# Patient Record
Sex: Male | Born: 1980 | Race: White | Hispanic: No | Marital: Married | Smoking: Never smoker
Health system: Southern US, Community
[De-identification: ages and names within clinical notes are randomized; demographics above are authoritative.]

## PROBLEM LIST (undated history)

## (undated) DIAGNOSIS — J45909 Unspecified asthma, uncomplicated: Secondary | ICD-10-CM

## (undated) DIAGNOSIS — I10 Essential (primary) hypertension: Secondary | ICD-10-CM

## (undated) DIAGNOSIS — F32A Depression, unspecified: Secondary | ICD-10-CM

## (undated) DIAGNOSIS — F419 Anxiety disorder, unspecified: Secondary | ICD-10-CM

## (undated) DIAGNOSIS — J449 Chronic obstructive pulmonary disease, unspecified: Secondary | ICD-10-CM

## (undated) DIAGNOSIS — F329 Major depressive disorder, single episode, unspecified: Secondary | ICD-10-CM

## (undated) HISTORY — PX: PARATHYROIDECTOMY: SHX19

---
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
## 2011-12-11 ENCOUNTER — Emergency Department: Payer: Self-pay | Admitting: Emergency Medicine

## 2011-12-11 LAB — COMPREHENSIVE METABOLIC PANEL
BUN: 10 mg/dL (ref 7–18)
Chloride: 106 mmol/L (ref 98–107)
Creatinine: 1.02 mg/dL (ref 0.60–1.30)
EGFR (African American): >60
EGFR (Non-African Amer.): >60
Glucose: 92 mg/dL (ref 65–99)
Potassium: 3.9 mmol/L (ref 3.5–5.1)
SGOT(AST): 24 U/L (ref 15–37)
SGPT (ALT): 32 U/L (ref 12–78)
Total Protein: 6.8 g/dL (ref 6.4–8.2)

## 2011-12-11 LAB — CBC WITH DIFFERENTIAL/PLATELET
Basophil #: 0 10*3/uL (ref 0.0–0.1)
Basophil %: 0.1 %
Eosinophil #: 0.7 10*3/uL (ref 0.0–0.7)
Eosinophil %: 5.5 %
HCT: 42.3 % (ref 40.0–52.0)
HGB: 14.5 g/dL (ref 13.0–18.0)
Lymphocyte %: 15.2 %
MCH: 29 pg (ref 26.0–34.0)
MCHC: 34.2 g/dL (ref 32.0–36.0)
MCV: 85 fL (ref 80–100)
Monocyte #: 0.9 x10 3/mm (ref 0.2–1.0)
Neutrophil #: 8.8 10*3/uL — ABNORMAL HIGH (ref 1.4–6.5)
Platelet: 234 10*3/uL (ref 150–440)
RDW: 13.3 % (ref 11.5–14.5)

## 2012-01-09 ENCOUNTER — Ambulatory Visit: Payer: Self-pay | Admitting: Family Medicine

## 2012-01-22 ENCOUNTER — Ambulatory Visit: Payer: Self-pay | Admitting: Family Medicine

## 2012-01-30 ENCOUNTER — Ambulatory Visit: Payer: Self-pay | Admitting: Orthopedic Surgery

## 2012-07-03 ENCOUNTER — Ambulatory Visit: Payer: Self-pay | Admitting: Family Medicine

## 2012-08-29 ENCOUNTER — Ambulatory Visit: Payer: Self-pay | Admitting: Anesthesiology

## 2012-09-25 ENCOUNTER — Ambulatory Visit: Payer: Self-pay | Admitting: Anesthesiology

## 2012-11-27 ENCOUNTER — Ambulatory Visit: Payer: Self-pay | Admitting: Anesthesiology
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
## 2017-06-25 ENCOUNTER — Emergency Department: Payer: BLUE CROSS/BLUE SHIELD

## 2017-06-25 ENCOUNTER — Observation Stay
Admission: EM | Admit: 2017-06-25 | Discharge: 2017-06-27 | Disposition: A | Discharge status: Home / Self Care | Payer: BLUE CROSS/BLUE SHIELD | Attending: Family Medicine | Admitting: Family Medicine

## 2017-06-25 ENCOUNTER — Encounter: Payer: Self-pay | Admitting: Emergency Medicine

## 2017-06-25 ENCOUNTER — Other Ambulatory Visit: Payer: Self-pay

## 2017-06-25 DIAGNOSIS — Z79891 Long term (current) use of opiate analgesic: Secondary | ICD-10-CM | POA: Insufficient documentation

## 2017-06-25 DIAGNOSIS — G8929 Other chronic pain: Secondary | ICD-10-CM | POA: Insufficient documentation

## 2017-06-25 DIAGNOSIS — Z79899 Other long term (current) drug therapy: Secondary | ICD-10-CM | POA: Diagnosis not present

## 2017-06-25 DIAGNOSIS — R1013 Epigastric pain: Principal | ICD-10-CM | POA: Insufficient documentation

## 2017-06-25 DIAGNOSIS — J45901 Unspecified asthma with (acute) exacerbation: Secondary | ICD-10-CM

## 2017-06-25 DIAGNOSIS — M549 Dorsalgia, unspecified: Secondary | ICD-10-CM | POA: Diagnosis not present

## 2017-06-25 DIAGNOSIS — I10 Essential (primary) hypertension: Secondary | ICD-10-CM | POA: Insufficient documentation

## 2017-06-25 DIAGNOSIS — M7918 Myalgia, other site: Secondary | ICD-10-CM

## 2017-06-25 DIAGNOSIS — R0789 Other chest pain: Secondary | ICD-10-CM | POA: Diagnosis present

## 2017-06-25 DIAGNOSIS — I209 Angina pectoris, unspecified: Secondary | ICD-10-CM

## 2017-06-25 DIAGNOSIS — R079 Chest pain, unspecified: Secondary | ICD-10-CM | POA: Diagnosis present

## 2017-06-25 DIAGNOSIS — R918 Other nonspecific abnormal finding of lung field: Secondary | ICD-10-CM | POA: Diagnosis not present

## 2017-06-25 DIAGNOSIS — Z8249 Family history of ischemic heart disease and other diseases of the circulatory system: Secondary | ICD-10-CM | POA: Insufficient documentation

## 2017-06-25 DIAGNOSIS — J45909 Unspecified asthma, uncomplicated: Secondary | ICD-10-CM | POA: Diagnosis not present

## 2017-06-25 HISTORY — DX: Unspecified asthma, uncomplicated: J45.909

## 2017-06-25 HISTORY — DX: Essential (primary) hypertension: I10

## 2017-06-25 LAB — INFLUENZA PANEL BY PCR (TYPE A & B)
INFLAPCR: NEGATIVE
Influenza B By PCR: NEGATIVE

## 2017-06-25 LAB — COMPREHENSIVE METABOLIC PANEL
ALT: 23 U/L (ref 17–63)
AST: 27 U/L (ref 15–41)
Albumin: 4.3 g/dL (ref 3.5–5.0)
Alkaline Phosphatase: 91 U/L (ref 38–126)
Anion gap: 10 (ref 5–15)
BUN: 9 mg/dL (ref 6–20)
CHLORIDE: 104 mmol/L (ref 101–111)
CO2: 25 mmol/L (ref 22–32)
Calcium: 9 mg/dL (ref 8.9–10.3)
Creatinine, Ser: 0.96 mg/dL (ref 0.61–1.24)
Glucose, Bld: 85 mg/dL (ref 65–99)
POTASSIUM: 3.5 mmol/L (ref 3.5–5.1)
Sodium: 139 mmol/L (ref 135–145)
Total Bilirubin: 0.8 mg/dL (ref 0.3–1.2)
Total Protein: 8.2 g/dL — ABNORMAL HIGH (ref 6.5–8.1)

## 2017-06-25 LAB — CBC
HCT: 45.1 % (ref 40.0–52.0)
Hemoglobin: 15.4 g/dL (ref 13.0–18.0)
MCH: 28 pg (ref 26.0–34.0)
MCHC: 34.1 g/dL (ref 32.0–36.0)
MCV: 81.9 fL (ref 80.0–100.0)
PLATELETS: 363 10*3/uL (ref 150–440)
RBC: 5.51 MIL/uL (ref 4.40–5.90)
RDW: 14 % (ref 11.5–14.5)
WBC: 13.4 10*3/uL — ABNORMAL HIGH (ref 3.8–10.6)

## 2017-06-25 LAB — LIPID PANEL
CHOLESTEROL: 211 mg/dL — AB (ref 0–200)
HDL: 54 mg/dL (ref >40)
LDL CALC: 138 mg/dL — AB (ref 0–99)
TRIGLYCERIDES: 93 mg/dL (ref <150)
Total CHOL/HDL Ratio: 3.9 RATIO
VLDL: 19 mg/dL (ref 0–40)

## 2017-06-25 LAB — LIPASE, BLOOD: Lipase: 29 U/L (ref 11–51)

## 2017-06-25 LAB — TROPONIN I

## 2017-06-25 MED ORDER — QUETIAPINE FUMARATE 25 MG PO TABS
50.0000 mg | ORAL_TABLET | Freq: Every day | ORAL | Status: DC
  Administered 2017-06-25 – 2017-06-26 (×2): 50 mg via ORAL
  Filled 2017-06-25 (×2): qty 2

## 2017-06-25 MED ORDER — MORPHINE SULFATE (PF) 2 MG/ML IV SOLN
2.0000 mg | Freq: Once | INTRAVENOUS | Status: AC
  Administered 2017-06-25: 2 mg via INTRAVENOUS
  Filled 2017-06-25: qty 1

## 2017-06-25 MED ORDER — SODIUM CHLORIDE 0.9 % IV BOLUS
1000.0000 mL | Freq: Once | INTRAVENOUS | Status: AC
  Administered 2017-06-25: 1000 mL via INTRAVENOUS

## 2017-06-25 MED ORDER — IPRATROPIUM-ALBUTEROL 0.5-2.5 (3) MG/3ML IN SOLN
3.0000 mL | RESPIRATORY_TRACT | Status: DC
  Administered 2017-06-25 – 2017-06-26 (×2): 3 mL via RESPIRATORY_TRACT
  Filled 2017-06-25 (×2): qty 3

## 2017-06-25 MED ORDER — MORPHINE SULFATE (PF) 4 MG/ML IV SOLN
4.0000 mg | Freq: Once | INTRAVENOUS | Status: AC
  Administered 2017-06-25: 4 mg via INTRAVENOUS

## 2017-06-25 MED ORDER — IPRATROPIUM-ALBUTEROL 0.5-2.5 (3) MG/3ML IN SOLN
3.0000 mL | Freq: Once | RESPIRATORY_TRACT | Status: AC
  Administered 2017-06-25: 3 mL via RESPIRATORY_TRACT
  Filled 2017-06-25: qty 3

## 2017-06-25 MED ORDER — DOCUSATE SODIUM 100 MG PO CAPS: 100.0000 mg | ORAL_CAPSULE | Freq: Two times a day (BID) | ORAL | Status: DC | PRN

## 2017-06-25 MED ORDER — ALBUTEROL SULFATE (2.5 MG/3ML) 0.083% IN NEBU
3.0000 mL | INHALATION_SOLUTION | Freq: Four times a day (QID) | RESPIRATORY_TRACT | Status: DC
  Filled 2017-06-25: qty 3

## 2017-06-25 MED ORDER — OXYCODONE-ACETAMINOPHEN 5-325 MG PO TABS
1.0000 | ORAL_TABLET | Freq: Once | ORAL | Status: AC
  Administered 2017-06-25: 1 via ORAL
  Filled 2017-06-25: qty 1

## 2017-06-25 MED ORDER — FAMOTIDINE IN NACL 20-0.9 MG/50ML-% IV SOLN
20.0000 mg | Freq: Once | INTRAVENOUS | Status: AC
  Administered 2017-06-25: 20 mg via INTRAVENOUS
  Filled 2017-06-25: qty 50

## 2017-06-25 MED ORDER — IPRATROPIUM-ALBUTEROL 0.5-2.5 (3) MG/3ML IN SOLN
3.0000 mL | Freq: Once | RESPIRATORY_TRACT | Status: AC
  Administered 2017-06-25: 3 mL via RESPIRATORY_TRACT

## 2017-06-25 MED ORDER — IPRATROPIUM-ALBUTEROL 0.5-2.5 (3) MG/3ML IN SOLN
RESPIRATORY_TRACT | Status: AC
  Administered 2017-06-25: 3 mL via RESPIRATORY_TRACT
  Filled 2017-06-25: qty 3

## 2017-06-25 MED ORDER — KETOROLAC TROMETHAMINE 30 MG/ML IJ SOLN
30.0000 mg | Freq: Once | INTRAMUSCULAR | Status: AC
  Administered 2017-06-25: 30 mg via INTRAVENOUS
  Filled 2017-06-25: qty 1

## 2017-06-25 MED ORDER — IOHEXOL 350 MG/ML SOLN
75.0000 mL | Freq: Once | INTRAVENOUS | Status: AC | PRN
  Administered 2017-06-25: 75 mL via INTRAVENOUS

## 2017-06-25 MED ORDER — TRAMADOL HCL 50 MG PO TABS
50.0000 mg | ORAL_TABLET | ORAL | Status: DC | PRN
  Administered 2017-06-25 – 2017-06-26 (×3): 50 mg via ORAL
  Filled 2017-06-25 (×4): qty 1

## 2017-06-25 MED ORDER — HEPARIN SODIUM (PORCINE) 5000 UNIT/ML IJ SOLN
5000.0000 [IU] | Freq: Three times a day (TID) | INTRAMUSCULAR | Status: DC
  Administered 2017-06-26 – 2017-06-27 (×3): 5000 [IU] via SUBCUTANEOUS
  Filled 2017-06-25 (×3): qty 1

## 2017-06-25 MED ORDER — METOPROLOL TARTRATE 25 MG PO TABS
12.5000 mg | ORAL_TABLET | Freq: Two times a day (BID) | ORAL | Status: DC
  Administered 2017-06-25 – 2017-06-26 (×2): 12.5 mg via ORAL
  Filled 2017-06-25 (×2): qty 1

## 2017-06-25 MED ORDER — MORPHINE SULFATE (PF) 4 MG/ML IV SOLN
INTRAVENOUS | Status: AC
  Administered 2017-06-25: 4 mg via INTRAVENOUS
  Filled 2017-06-25: qty 1

## 2017-06-25 MED ORDER — IRBESARTAN 150 MG PO TABS
150.0000 mg | ORAL_TABLET | Freq: Every day | ORAL | Status: DC
  Administered 2017-06-25 – 2017-06-26 (×2): 150 mg via ORAL
  Filled 2017-06-25 (×2): qty 1

## 2017-06-25 MED ORDER — METHYLPREDNISOLONE SODIUM SUCC 125 MG IJ SOLR
125.0000 mg | Freq: Once | INTRAMUSCULAR | Status: AC
  Administered 2017-06-25: 125 mg via INTRAVENOUS
  Filled 2017-06-25: qty 2

## 2017-06-25 NOTE — ED Notes (Signed)
This RN spoke with lab they are running Lipase now.

## 2017-06-25 NOTE — ED Notes (Signed)
Pt was exp. wheezing when this RN assessed. This RN obtained verbal for 1 DUONEB tx. administered at this time.

## 2017-06-25 NOTE — ED Provider Notes (Signed)
[facility] Emergency Department Provider Note ____________________________________________   First MD Initiated Contact with Patient 06/25/17 1557     (approximate)  I have reviewed the triage vital signs and the nursing notes.   HISTORY  Chief Complaint Chest Pain    HPI Raymond Haney is a 37 y.o. male with history of asthma and hypertension who presents with shortness of breath over the last 2 days, gradual onset, persistent course, associated with wheezing, and feeling somewhat similar to prior asthma.  Patient states that in addition he has had chest pain, substernal but radiating to the left side of the chest, sharp, and worse with deep inspiration.  He states this is not typical for him during his asthma exacerbations.   Past Medical History:  Diagnosis Date  . Asthma   . Hypertension     There are no active problems to display for this patient.   History reviewed. No pertinent surgical history.  Prior to Admission medications   Medication Sig Start Date End Date Taking? Authorizing Provider  valsartan (DIOVAN) 160 MG tablet Take 1 tablet by mouth daily. 05/22/17   [provider]    Allergies Patient has no known allergies.  No family history on file.  Social History Social History   Tobacco Use  . Smoking status: Never Smoker  Substance Use Topics  . Alcohol use: Not on file  . Drug use: Not on file    Review of Systems  Constitutional: No fever. Eyes: No redness. ENT: No sore throat. Cardiovascular: Positive for chest pain. Respiratory: Positive for shortness of breath. Gastrointestinal: No vomiting.   Genitourinary: Negative for flank pain.  Musculoskeletal: Negative for back pain. Skin: Negative for rash. Neurological: Negative for headache.   ____________________________________________   PHYSICAL EXAM:  VITAL SIGNS: ED Triage Vitals  Enc Vitals Group     BP 06/25/17 1527 (!) 166/126     Pulse  Rate 06/25/17 1527 (!) 103     Resp 06/25/17 1527 20     Temp 06/25/17 1527 99 F (37.2 C)     Temp Source 06/25/17 1527 Oral     SpO2 06/25/17 1527 99 %     Weight 06/25/17 1532 250 lb (113.4 kg)     Height 06/25/17 1532 5\' 9"  (1.753 m)     Head Circumference --      Peak Flow --      Pain Score 06/25/17 1532 5     Pain Loc --      Pain Edu? --      Excl. in GC? --     Constitutional: Alert and oriented.  Uncomfortable appearing but in no acute distress. Eyes: Conjunctivae are normal.  Head: Atraumatic. Nose: No congestion/rhinnorhea. Mouth/Throat: Mucous membranes are slightly dry.   Neck: Normal range of motion.  Cardiovascular: Tachycardic, regular rhythm. Grossly normal heart sounds.  Good peripheral circulation. Respiratory: Normal respiratory effort.  No retractions.  Diffuse bilateral wheezing. Gastrointestinal: No distention.  Musculoskeletal:   Extremities warm and well perfused.  Neurologic:  Normal speech and language. No gross focal neurologic deficits are appreciated.  Skin:  Skin is warm and dry. No rash noted. Psychiatric: Mood and affect are normal. Speech and behavior are normal.  ____________________________________________   LABS (all labs ordered are listed, but only abnormal results are displayed)  Labs Reviewed  CBC - Abnormal; Notable for the following components:      Result Value   WBC 13.4 (*)    All other  components within normal limits  COMPREHENSIVE METABOLIC PANEL - Abnormal; Notable for the following components:   Total Protein 8.2 (*)    All other components within normal limits  TROPONIN I  INFLUENZA PANEL BY PCR (TYPE A & B)  LIPASE, BLOOD   ____________________________________________  EKG  ED ECG REPORT I, Dionne BucySebastian Adalin Vanderploeg, the attending physician, personally viewed and interpreted this ECG.  Date: 06/25/2017 EKG Time: 1522 Rate: 110 Rhythm: Sinus tachycardia QRS Axis: normal Intervals: normal ST/T Wave abnormalities:  normal Narrative Interpretation: no evidence of acute ischemia   ED ECG REPORT I, Dionne BucySebastian Adlai Nieblas, the attending physician, personally viewed and interpreted this ECG.  Date: 06/25/2017 EKG Time: 1754 Rate: 122 Rhythm: Sinus tachycardia QRS Axis: normal Intervals: LP FB ST/T Wave abnormalities: normal Narrative Interpretation: no evidence of acute ischemia; no significant change when compared to EKG of 1522 today  ____________________________________________  RADIOLOGY  CXR: No focal infiltrate or other acute findings  ____________________________________________   PROCEDURES  Procedure(s) performed: No  Procedures  Critical Care performed: No ____________________________________________   INITIAL IMPRESSION / ASSESSMENT AND PLAN / ED COURSE  Pertinent labs & imaging results that were available during my care of the patient were reviewed by me and considered in my medical decision making (see chart for details).  37 year old male with history of asthma and hypertension presents with shortness of breath and wheezing over the last 2 days which is similar to prior asthma, but with chest pain which is not typical for him.  On exam, the patient has hypertension and borderline tachycardia but is oxygenating well, and has diffuse wheezing on lung exam.  Past medical records reviewed in Epic and are noncontributory; patient has no recent ED visits or hospitalizations.  Overall presentation is consistent with asthma exacerbation, viral bronchitis, or less likely pneumonia.  Although the chest pain is atypical for the patient I have low suspicion for ACS given the significant wheezing and the atypical nature of the pain, as well as his age.  Also low suspicion for PE given the presence of wheezing, and the lack of signs or symptoms suggestive of DVT.  Plan: Chest x-ray, nebulizer, steroid, analgesia, labs, and reassess.    ----------------------------------------- 6:11 PM  on 06/25/2017 -----------------------------------------  On reassessment, patient reports no significant improvement in the shortness of breath and states that the pain is actually somewhat worse.  He did appear slightly more comfortable for some time when I reassessed him, but then when we went back to the room the patient was hyperventilating and appeared very uncomfortable.  Pain now controlled with morphine.  Although patient has no hypoxia and no specific PE risk factors, given the persistent severe chest pain and the tachycardia, there is some concern for PE.  I will obtain a CT chest to rule out.  If negative, likely admit for asthma.  ----------------------------------------- 8:28 PM on 06/25/2017 -----------------------------------------  CT chest is negative.  Patient reports improved pain although it is still present.  His shortness of breath is somewhat improved.  On exam he still has diffuse bilateral wheezing.  His O2 sat is in the high 90s but drops with any exertion, and his heart rate goes to the 120s with minimal exertion such as moving in the bed.  At this time, given the abnormal vital signs and persistent significant wheezing, patient would be appropriate for admission.  I will sign the patient out to the hospitalist. ____________________________________________   FINAL CLINICAL IMPRESSION(S) / ED DIAGNOSES  Final diagnoses:  Asthma with  acute exacerbation, unspecified asthma severity, unspecified whether persistent  Atypical chest pain      NEW MEDICATIONS STARTED DURING THIS VISIT:  New Prescriptions   No medications on file     Note:  This document was prepared using Dragon voice recognition software and may include unintentional dictation errors.    Dionne Bucy, MD 06/25/17 2029

## 2017-06-25 NOTE — ED Triage Notes (Signed)
Just prior to arrival states he was laying down and something was "sitting on my chest". Speaking full sentences with no wheeze noted. States too inhaler at home.

## 2017-06-25 NOTE — ED Notes (Signed)
Pt called out stating chest pain was worse. Pt is Stable no changes noted.

## 2017-06-25 NOTE — ED Notes (Signed)
This Rn answered pt light, in person. Pt was hyperventilating, and is diaphoretic. Pt Vs as follows: 115 HR 95% RA, 156/120 BP. Pt states his body is numb. EDP made aware.

## 2017-06-25 NOTE — ED Triage Notes (Signed)
First Nurse Note:  States had an asthma attack today about 15 minutes PTA.  Arrives to ED with C/O chest pain.

## 2017-06-25 NOTE — ED Notes (Signed)
Pt has had chest pain and SHOB for the past few days. Pt has asthma and hx of parathyroidectomy.  Pt is transported to xray at this time.

## 2017-06-25 NOTE — H&P (Signed)
[employer] [facility] [place] at [facility]   PATIENT NAME: Raymond Haney    MR#:  [id number]  DATE OF BIRTH:  [birth year]  DATE OF ADMISSION:  06/25/2017  PRIMARY CARE PHYSICIAN: System, Pcp Not In   REQUESTING/REFERRING PHYSICIAN: [place]  CHIEF COMPLAINT:   Chief Complaint  Patient presents with  . Chest Pain    HISTORY OF PRESENT ILLNESS: Raymond Haney  is a 37 y.o. male with a known history of asthma, hypertension- started having chest pain which is all over the chest and getting worse withlaying flat in the bed and feels little better while sitting up.the pain is pressure-like and constant. 8-9 out of 10.Started having pain 2 days ago the day before he had done some heavy work of shoveling. Patient also have some shortness of breath and some wheezing associated with this. CT scan the chest was done in the ER which was noncontributory, patient's father had coronary disease and recent death so ER physician suggested to admit for further cardiac workup.  PAST MEDICAL HISTORY:   Past Medical History:  Diagnosis Date  . Asthma   . Hypertension     PAST SURGICAL HISTORY: History reviewed. No pertinent surgical history.  SOCIAL HISTORY:  Social History   Tobacco Use  . Smoking status: Never Smoker  Substance Use Topics  . Alcohol use: Not on file    FAMILY HISTORY:  Family History  Problem Relation Age of Onset  . CAD Father     DRUG ALLERGIES: No Known Allergies  REVIEW OF SYSTEMS:   CONSTITUTIONAL: No fever, fatigue or weakness.  EYES: No blurred or double vision.  EARS, NOSE, AND THROAT: No tinnitus or ear pain.  RESPIRATORY: No cough, shortness of breath, wheezing or hemoptysis.  CARDIOVASCULAR: positive for chest pain,no orthopnea, edema.  GASTROINTESTINAL: No nausea, vomiting, diarrhea or abdominal pain.  GENITOURINARY: No dysuria, hematuria.  ENDOCRINE: No polyuria, nocturia,  HEMATOLOGY: No anemia, easy bruising or bleeding SKIN: No rash or  lesion. MUSCULOSKELETAL: No joint pain or arthritis.   NEUROLOGIC: No tingling, numbness, weakness.  PSYCHIATRY: No anxiety or depression.   MEDICATIONS AT HOME:  Prior to Admission medications   Medication Sig Start Date End Date Taking? Authorizing Provider  QUEtiapine (SEROQUEL) 50 MG tablet Take 1 tablet by mouth at bedtime. 06/18/17  Yes [provider]  valsartan (DIOVAN) 160 MG tablet Take 1 tablet by mouth daily. 05/22/17  Yes [provider]  albuterol (PROVENTIL HFA;VENTOLIN HFA) 108 (90 Base) MCG/ACT inhaler Inhale 2 puffs into the lungs every 6 (six) hours as needed. 05/28/17   [provider]  albuterol (PROVENTIL) (2.5 MG/3ML) 0.083% nebulizer solution Take 3 mLs by nebulization every 6 (six) hours as needed. 05/08/17   [provider]  traMADol (ULTRAM) 50 MG tablet Take 1 tablet by mouth every 4 (four) hours as needed. 06/03/17   [provider]      PHYSICAL EXAMINATION:   VITAL SIGNS: Blood pressure (!) 142/115, pulse (!) 103, temperature 99 F (37.2 C), temperature source Oral, resp. rate 15, height 5\' 9"  (1.753 m), weight 113.4 kg (250 lb), SpO2 99 %.  GENERAL:  37 y.o.-year-old patient lying in the bed with no acute distress.  EYES: Pupils equal, round, reactive to light and accommodation. No scleral icterus. Extraocular muscles intact.  HEENT: Head atraumatic, normocephalic. Oropharynx and nasopharynx clear.  NECK:  Supple, no jugular venous distention. No thyroid enlargement, no tenderness.  LUNGS: Normal breath sounds bilaterally, no wheezing, rales,rhonchi or crepitation. No  use of accessory muscles of respiration.  CARDIOVASCULAR: S1, S2 normal. No murmurs, rubs, or gallops.  ABDOMEN: Soft, nontender, nondistended. Bowel sounds present. No organomegaly or mass.  EXTREMITIES: No pedal edema, cyanosis, or clubbing.  NEUROLOGIC: Cranial nerves II through XII are intact. Muscle strength 5/5 in all extremities. Sensation intact.  Gait not checked.  PSYCHIATRIC: The patient is alert and oriented x 3.  SKIN: No obvious rash, lesion, or ulcer.   LABORATORY PANEL:   CBC Recent Labs  Lab 06/25/17 1540  WBC 13.4*  HGB 15.4  HCT 45.1  PLT 363  MCV 81.9  MCH 28.0  MCHC 34.1  RDW 14.0   ------------------------------------------------------------------------------------------------------------------  Chemistries  Recent Labs  Lab 06/25/17 1540  NA 139  K 3.5  CL 104  CO2 25  GLUCOSE 85  BUN 9  CREATININE 0.96  CALCIUM 9.0  AST 27  ALT 23  ALKPHOS 91  BILITOT 0.8   ------------------------------------------------------------------------------------------------------------------ estimated creatinine clearance is 129.6 mL/min (by C-G formula based on SCr of 0.96 mg/dL). ------------------------------------------------------------------------------------------------------------------ No results for input(s): TSH, T4TOTAL, T3FREE, THYROIDAB in the last 72 hours.  Invalid input(s): FREET3   Coagulation profile No results for input(s): INR, PROTIME in the last 168 hours. ------------------------------------------------------------------------------------------------------------------- No results for input(s): DDIMER in the last 72 hours. -------------------------------------------------------------------------------------------------------------------  Cardiac Enzymes Recent Labs  Lab 06/25/17 1540  TROPONINI <0.03   ------------------------------------------------------------------------------------------------------------------ Invalid input(s): POCBNP  ---------------------------------------------------------------------------------------------------------------  Urinalysis No results found for: COLORURINE, APPEARANCEUR, LABSPEC, PHURINE, GLUCOSEU, HGBUR, BILIRUBINUR, KETONESUR, PROTEINUR, UROBILINOGEN, NITRITE, LEUKOCYTESUR   RADIOLOGY: Dg Chest 2 View  Result Date: 06/25/2017 CLINICAL  DATA:  Chest pain and shortness of breath. History of asthma. EXAM: CHEST - 2 VIEW COMPARISON:  None. FINDINGS: The heart size and mediastinal contours are within normal limits. Both lungs are clear. The visualized skeletal structures are unremarkable. IMPRESSION: No active cardiopulmonary disease. Electronically Signed   By: Elsie StainJohn T Curnes M.D.   On: 06/25/2017 16:01   Ct Angio Chest Pe W And/or Wo Contrast  Result Date: 06/25/2017 CLINICAL DATA:  Chest pain and shortness of breath. History of asthma. EXAM: CT ANGIOGRAPHY CHEST WITH CONTRAST TECHNIQUE: Multidetector CT imaging of the chest was performed using the standard protocol during bolus administration of intravenous contrast. Multiplanar CT image reconstructions and MIPs were obtained to evaluate the vascular anatomy. CONTRAST:  75mL OMNIPAQUE IOHEXOL 350 MG/ML SOLN COMPARISON:  Chest x-ray from same day. FINDINGS: Cardiovascular: Suboptimal opacification of the segmental pulmonary arteries due to contrast bolus timing. Evaluation is also limited due to motion artifact. No central or lobar pulmonary embolism. Normal heart size. No pericardial effusion. Normal caliber thoracic aorta. Mediastinum/Nodes: No enlarged mediastinal, hilar, or axillary lymph nodes. Thyroid gland, trachea, and esophagus demonstrate no significant findings. Lungs/Pleura: Lungs are clear. No pleural effusion or pneumothorax. No suspicious pulmonary nodule. Upper Abdomen: No acute abnormality. Hepatic steatosis. 1.8 cm round, enhancing lesion in the spleen. Musculoskeletal: Bilateral gynecomastia. No acute or significant osseous findings. Review of the MIP images confirms the above findings. IMPRESSION: 1. No central or lobar pulmonary embolism. Evaluation of the segmental pulmonary arteries is limited due to suboptimal opacification and motion artifact. 2.  No acute intrathoracic process. 3. Hepatic steatosis. 4. Indeterminate 1.8 cm enhancing lesion in the spleen. Recommend  follow-up contrast-enhanced MRI of the abdomen in 6 months for further evaluation. This recommendation follows ACR consensus guidelines: White Paper of the ACR Incidental Findings Committee II on Splenic and Nodal Findings. J Am Coll Radiol [phone];10:789-794. Electronically Signed   By: Chrissie NoaWilliam  Howell Pringle M.D.   On: 06/25/2017 19:36    EKG: Orders placed or performed during the hospital encounter of 06/25/17  . EKG 12-Lead  . EKG 12-Lead  . ED EKG within 10 minutes  . ED EKG within 10 minutes    IMPRESSION AND PLAN:  * chest pain   Monitor on telemetry, follow serial troponin, echocardiogram.   Check lipid panel and hemoglobin A1c.   If further workups are negative then we can attribute this pain towards his heavy physical activities 3-4 days ago and discharged him with muscle relaxors and analgesics.  * asthma   Currently mild wheezing, I will give DuoNeb nebulizer treatment.  * Hypertension   Continue medication and metoprolol.  All the records are reviewed and case discussed with ED provider. Management plans discussed with the patient, family and they are in agreement.  CODE STATUS: full.    TOTAL TIME TAKING CARE OF THIS PATIENT: 45 minutes.    Altamese Dilling M.D on 06/25/2017   Between 7am to 6pm - Pager - [phone]  After 6pm go to [email or web address] - password [email or web address]  [facility]  Office  [phone]  CC: Primary care physician; System, Pcp Not In   Note: This dictation was prepared with Dragon dictation along with smaller phrase technology. Any transcriptional errors that result from this process are unintentional.

## 2017-06-26 ENCOUNTER — Observation Stay: Payer: BLUE CROSS/BLUE SHIELD

## 2017-06-26 ENCOUNTER — Observation Stay
Admit: 2017-06-26 | Discharge: 2017-06-26 | Disposition: A | Discharge status: Home / Self Care | Payer: BLUE CROSS/BLUE SHIELD | Attending: Internal Medicine | Admitting: Internal Medicine

## 2017-06-26 DIAGNOSIS — R1013 Epigastric pain: Secondary | ICD-10-CM | POA: Diagnosis not present

## 2017-06-26 DIAGNOSIS — M7918 Myalgia, other site: Secondary | ICD-10-CM | POA: Diagnosis not present

## 2017-06-26 LAB — BASIC METABOLIC PANEL
ANION GAP: 11 (ref 5–15)
BUN: 11 mg/dL (ref 6–20)
CO2: 23 mmol/L (ref 22–32)
CREATININE: 0.99 mg/dL (ref 0.61–1.24)
Calcium: 8.8 mg/dL — ABNORMAL LOW (ref 8.9–10.3)
Chloride: 106 mmol/L (ref 101–111)
Glucose, Bld: 143 mg/dL — ABNORMAL HIGH (ref 65–99)
Potassium: 3.6 mmol/L (ref 3.5–5.1)
SODIUM: 140 mmol/L (ref 135–145)

## 2017-06-26 LAB — SEDIMENTATION RATE: Sed Rate: 16 mm/hr — ABNORMAL HIGH (ref 0–15)

## 2017-06-26 LAB — CBC
HCT: 43 % (ref 40.0–52.0)
HEMOGLOBIN: 14.4 g/dL (ref 13.0–18.0)
MCH: 27.8 pg (ref 26.0–34.0)
MCHC: 33.4 g/dL (ref 32.0–36.0)
MCV: 83.3 fL (ref 80.0–100.0)
PLATELETS: 365 10*3/uL (ref 150–440)
RBC: 5.17 MIL/uL (ref 4.40–5.90)
RDW: 14.4 % (ref 11.5–14.5)
WBC: 14.1 10*3/uL — AB (ref 3.8–10.6)

## 2017-06-26 LAB — LACTIC ACID, PLASMA
LACTIC ACID, VENOUS: 1.8 mmol/L (ref 0.5–1.9)
LACTIC ACID, VENOUS: 3.1 mmol/L — AB (ref 0.5–1.9)

## 2017-06-26 LAB — HEMOGLOBIN A1C
Hgb A1c MFr Bld: 5.4 % (ref 4.8–5.6)
Mean Plasma Glucose: 108.28 mg/dL

## 2017-06-26 LAB — TROPONIN I

## 2017-06-26 MED ORDER — GI COCKTAIL ~~LOC~~
30.0000 mL | Freq: Once | ORAL | Status: AC
  Administered 2017-06-26: 30 mL via ORAL
  Filled 2017-06-26: qty 30

## 2017-06-26 MED ORDER — CYCLOBENZAPRINE HCL 10 MG PO TABS
5.0000 mg | ORAL_TABLET | Freq: Three times a day (TID) | ORAL | Status: DC
  Administered 2017-06-26 – 2017-06-27 (×3): 5 mg via ORAL
  Filled 2017-06-26 (×3): qty 1

## 2017-06-26 MED ORDER — CYCLOBENZAPRINE HCL 10 MG PO TABS
10.0000 mg | ORAL_TABLET | Freq: Once | ORAL | Status: AC
  Administered 2017-06-26: 10 mg via ORAL
  Filled 2017-06-26: qty 1

## 2017-06-26 MED ORDER — METOPROLOL TARTRATE 50 MG PO TABS
50.0000 mg | ORAL_TABLET | Freq: Two times a day (BID) | ORAL | Status: DC
  Administered 2017-06-26 – 2017-06-27 (×3): 50 mg via ORAL
  Filled 2017-06-26 (×3): qty 1

## 2017-06-26 MED ORDER — MORPHINE SULFATE (PF) 2 MG/ML IV SOLN
2.0000 mg | INTRAVENOUS | Status: DC | PRN
  Administered 2017-06-26 – 2017-06-27 (×7): 2 mg via INTRAVENOUS
  Filled 2017-06-26 (×7): qty 1

## 2017-06-26 MED ORDER — HYDRALAZINE HCL 20 MG/ML IJ SOLN
10.0000 mg | INTRAMUSCULAR | Status: DC | PRN
  Administered 2017-06-26: 10 mg via INTRAVENOUS
  Filled 2017-06-26: qty 1

## 2017-06-26 MED ORDER — AMLODIPINE BESYLATE 5 MG PO TABS
5.0000 mg | ORAL_TABLET | Freq: Every day | ORAL | Status: DC
  Administered 2017-06-26 – 2017-06-27 (×2): 5 mg via ORAL
  Filled 2017-06-26 (×3): qty 1

## 2017-06-26 MED ORDER — ACETAMINOPHEN 325 MG PO TABS: 650.0000 mg | ORAL_TABLET | Freq: Four times a day (QID) | ORAL | Status: DC | PRN

## 2017-06-26 MED ORDER — PANTOPRAZOLE SODIUM 40 MG IV SOLR
40.0000 mg | Freq: Two times a day (BID) | INTRAVENOUS | Status: DC
  Administered 2017-06-26 – 2017-06-27 (×3): 40 mg via INTRAVENOUS
  Filled 2017-06-26 (×3): qty 40

## 2017-06-26 MED ORDER — SUCRALFATE 1 GM/10ML PO SUSP
1.0000 g | Freq: Three times a day (TID) | ORAL | Status: DC
  Administered 2017-06-26 – 2017-06-27 (×5): 1 g via ORAL
  Filled 2017-06-26 (×8): qty 10

## 2017-06-26 MED ORDER — ADULT MULTIVITAMIN W/MINERALS CH
1.0000 | ORAL_TABLET | Freq: Every day | ORAL | Status: DC
  Administered 2017-06-26 – 2017-06-27 (×2): 1 via ORAL
  Filled 2017-06-26 (×2): qty 1

## 2017-06-26 MED ORDER — PREMIER PROTEIN SHAKE
11.0000 [oz_av] | Freq: Two times a day (BID) | ORAL | Status: DC
  Administered 2017-06-26: 11 [oz_av] via ORAL

## 2017-06-26 MED ORDER — ALBUTEROL SULFATE (2.5 MG/3ML) 0.083% IN NEBU: 3.0000 mL | INHALATION_SOLUTION | Freq: Four times a day (QID) | RESPIRATORY_TRACT | Status: DC | PRN

## 2017-06-26 NOTE — Progress Notes (Signed)
Pt's b/p still elevated after receiving morning dose of metoprolol. Dr. Katheren ShamsSalary notified and orders for IV Hydralazine PRN, and increase dose metoprolol 50 mg. Will Administer and continue to monitor,

## 2017-06-26 NOTE — Progress Notes (Signed)
Pt called this RN stating he was woken up from 10 out of 10 chest pain. Describing as pressure., after receiving 50mg  tramadol. Pt's BP is also still elevated after receiving 12.5mg   po metoprolol and 150mg  po irbesartan.  Vitals:   06/25/17 2349 06/26/17 0121  BP:  (!) 178/112  Pulse:  (!) 107  Resp:  18  Temp:    SpO2: 97% 98%  Spoke with Dr. Caryn BeeMaier, she gave orders for one time dose 10mg  flexeril, 5mg  norvasc daily starting now and 2mg  morphine for pain q4hr PRN.  Will give norvasc and start with flexeril for pain.  Shirley FriarAlexis Miller, RN, BSN

## 2017-06-26 NOTE — Plan of Care (Signed)
  Problem: Pain Managment: Goal: General experience of comfort will improve Outcome: Not Progressing   Pt admitted with chest pain, pt still currently having chest pain, I have treated pt 3 times with PRNs, tramadol, flexeril, and morphine.   On tele box [id number], skin intact both verified by takera nesbitt, RN.  Wife at bedside, BP still high but starting to come down.

## 2017-06-26 NOTE — Consult Note (Signed)
Midge Minium, MD [facility]  [address], [address] [place], [place] [address] Phone: [phone] Fax : [phone]  Consultation  Referring Provider:     Dr. Katheren Shams Primary Care Physician:  System, Pcp Not In Primary Gastroenterologist: Gentry Fitz         Reason for Consultation:     Abdominal pain  Date of Admission:  06/25/2017 Date of Consultation:  06/26/2017         HPI:   Raymond Haney is a 37 y.o. male who comes in with a history of 4 days of abdominal pain.  The patient states that 2 days before the pain started he was shoveling a large amount of gravel.  The patient also suffers from chronic back pain.  The patient now reports that his upper abdominal pain is both on the left and right and epigastric area.  It is not made worse with any eating drinking or moving his bowels.  He also denies any nausea or vomiting.  The patient reports that there is nothing associated with his GI tract that makes the pain any better or worse.  He states that when the pain came he became short of breath and was taking his inhalers but the pain did not go away and increased over the past few days.  The patient denies fatty food or greasy foods make the pain any worse.  He has never had this pain in the past.  He had a CT scan of the chest that showed a 1.8 cm lesion of the spleen and some fatty liver but his liver enzymes were normal.  Past Medical History:  Diagnosis Date  . Asthma   . Hypertension     Past Surgical History:  Procedure Laterality Date  . PARATHYROIDECTOMY      Prior to Admission medications   Medication Sig Start Date End Date Taking? Authorizing Provider  QUEtiapine (SEROQUEL) 50 MG tablet Take 1 tablet by mouth at bedtime. 06/18/17  Yes [provider]  valsartan (DIOVAN) 160 MG tablet Take 1 tablet by mouth daily. 05/22/17  Yes [provider]  albuterol (PROVENTIL HFA;VENTOLIN HFA) 108 (90 Base) MCG/ACT inhaler Inhale 2 puffs into the lungs every 6 (six)  hours as needed. 05/28/17   [provider]  albuterol (PROVENTIL) (2.5 MG/3ML) 0.083% nebulizer solution Take 3 mLs by nebulization every 6 (six) hours as needed. 05/08/17   [provider]  traMADol (ULTRAM) 50 MG tablet Take 1 tablet by mouth every 4 (four) hours as needed. 06/03/17   [provider]    Family History  Problem Relation Age of Onset  . CAD Father      Social History   Tobacco Use  . Smoking status: Never Smoker  . Smokeless tobacco: Never Used  Substance Use Topics  . Alcohol use: Not Currently  . Drug use: Not Currently    Allergies as of 06/25/2017  . (No Known Allergies)    Review of Systems:    All systems reviewed and negative except where noted in HPI.   Physical Exam:  Vital signs in last 24 hours: Temp:  [98.4 F (36.9 C)-98.6 F (37 C)] 98.6 F (37 C) (04/02 0807) Pulse Rate:  [73-114] 73 (04/02 1135) Resp:  [16-29] 18 (04/02 0807) BP: (151-178)/(102-130) 173/118 (04/02 1135) SpO2:  [97 %-100 %] 98 % (04/02 1135) Weight:  [226 lb 6.4 oz (102.7 kg)] 226 lb 6.4 oz (102.7 kg) (04/01 2313) Last BM Date: 06/24/17 General:   Pleasant,  cooperative in NAD Head:  Normocephalic and atraumatic. Eyes:   No icterus.   Conjunctiva pink. PERRLA. Ears:  Normal auditory acuity. Neck:  Supple; no masses or thyroidomegaly Lungs: Respirations even and unlabored. Lungs clear to auscultation bilaterally.   No wheezes, crackles, or rhonchi.  Heart:  Regular rate and rhythm;  Without murmur, clicks, rubs or gallops Abdomen:  Soft, nondistended, abdominal tenderness to one finger palpation while flexing the abdominal wall muscles by raising the patient's legs 6 inches above the bed.  Positive Carnett sign. normal bowel sounds. No appreciable masses or hepatomegaly.  No rebound or guarding.  Rectal:  Not performed. Msk:  Symmetrical without gross deformities.  Positive tenderness to one finger palpation while flexing the abdominal wall muscles  by raising the patient's legs above the exam Extremities:  Without edema, cyanosis or clubbing. Neurologic:  Alert and oriented x3;  grossly normal neurologically. Skin:  Intact without significant lesions or rashes. Cervical Nodes:  No significant cervical adenopathy. Psych:  Alert and cooperative. Normal affect.  LAB RESULTS: Recent Labs    06/25/17 1540 06/26/17 0332  WBC 13.4* 14.1*  HGB 15.4 14.4  HCT 45.1 43.0  PLT 363 365   BMET Recent Labs    06/25/17 1540 06/26/17 0332  NA 139 140  K 3.5 3.6  CL 104 106  CO2 25 23  GLUCOSE 85 143*  BUN 9 11  CREATININE 0.96 0.99  CALCIUM 9.0 8.8*   LFT Recent Labs    06/25/17 1540  PROT 8.2*  ALBUMIN 4.3  AST 27  ALT 23  ALKPHOS 91  BILITOT 0.8   PT/INR No results for input(s): LABPROT, INR in the last 72 hours.  STUDIES: Dg Chest 2 View  Result Date: 06/25/2017 CLINICAL DATA:  Chest pain and shortness of breath. History of asthma. EXAM: CHEST - 2 VIEW COMPARISON:  None. FINDINGS: The heart size and mediastinal contours are within normal limits. Both lungs are clear. The visualized skeletal structures are unremarkable. IMPRESSION: No active cardiopulmonary disease. Electronically Signed   By: Elsie Stain M.D.   On: 06/25/2017 16:01   Ct Angio Chest Pe W And/or Wo Contrast  Result Date: 06/25/2017 CLINICAL DATA:  Chest pain and shortness of breath. History of asthma. EXAM: CT ANGIOGRAPHY CHEST WITH CONTRAST TECHNIQUE: Multidetector CT imaging of the chest was performed using the standard protocol during bolus administration of intravenous contrast. Multiplanar CT image reconstructions and MIPs were obtained to evaluate the vascular anatomy. CONTRAST:  75mL OMNIPAQUE IOHEXOL 350 MG/ML SOLN COMPARISON:  Chest x-ray from same day. FINDINGS: Cardiovascular: Suboptimal opacification of the segmental pulmonary arteries due to contrast bolus timing. Evaluation is also limited due to motion artifact. No central or lobar pulmonary  embolism. Normal heart size. No pericardial effusion. Normal caliber thoracic aorta. Mediastinum/Nodes: No enlarged mediastinal, hilar, or axillary lymph nodes. Thyroid gland, trachea, and esophagus demonstrate no significant findings. Lungs/Pleura: Lungs are clear. No pleural effusion or pneumothorax. No suspicious pulmonary nodule. Upper Abdomen: No acute abnormality. Hepatic steatosis. 1.8 cm round, enhancing lesion in the spleen. Musculoskeletal: Bilateral gynecomastia. No acute or significant osseous findings. Review of the MIP images confirms the above findings. IMPRESSION: 1. No central or lobar pulmonary embolism. Evaluation of the segmental pulmonary arteries is limited due to suboptimal opacification and motion artifact. 2.  No acute intrathoracic process. 3. Hepatic steatosis. 4. Indeterminate 1.8 cm enhancing lesion in the spleen. Recommend follow-up contrast-enhanced MRI of the abdomen in 6 months for further evaluation. This recommendation follows ACR  consensus guidelines: White Paper of the ACR Incidental Findings Committee II on Splenic and Nodal Findings. J Am Coll Radiol [phone];10:[id number]. Electronically Signed   By: Obie DredgeWilliam T Derry M.D.   On: 06/25/2017 19:36   [place] Liver Doppler  Result Date: 06/26/2017 CLINICAL DATA:  Epigastric pain x3 days. Hepatic steatosis described on recent CT chest. EXAM: DUPLEX ULTRASOUND OF LIVER TECHNIQUE: Color and duplex Doppler ultrasound was performed to evaluate the hepatic in-flow and out-flow vessels. COMPARISON:  CT 06/25/2017 FINDINGS: Portal Vein 10 mm diameter. No evidence of occlusion or thrombus. Velocities (all hepatopetal): Main:  34-52 cm/sec Right:  29 cm/sec Left:  26 cm/sec Hepatic Vein Velocities (all hepatofugal): Right:  27 cm/sec Middle:  21 cm/sec Left:  20 cm/sec Hepatic Artery Velocity:  60 cm/sec Spleen 7.2 x 11 x 3.9 cm (volume = 160 cm^3). Splenic Vein: No evidence of occlusion or thrombus. Velocity: 14 cm/sec Varices: None identified  Ascites: None IMPRESSION: 1. Unremarkable hepatic vascular Doppler evaluation. Electronically Signed   By: Corlis Leak  Hassell M.D.   On: 06/26/2017 11:31      Impression / Plan:   Raymond Haney is a 37 y.o. y/o male with musculoskeletal pain that is reproducible by flexing the abdominal wall muscles and palpating the muscles lightly with one finger.  The patient's symptoms are not consistent with peptic ulcer disease or GI cause since he has no exacerbating or relieving symptoms related to his GI tract.  The patient CT scan did not show any other abnormalities to explain his pain.  The patient will be treated with Flexeril which he states he has taken in the past because he has had back pain.  He should also take NSAIDs with food upon discharge to help the abdominal muscular pain and he has been told to use warm compresses.  I will sign off.  Please call if any further GI concerns or questions.  We would like to thank you for the opportunity to participate in the care of Raymond Haney.    Thank you for involving me in the care of this patient.      LOS: 0 days   Midge Miniumarren Dee Maday, MD  06/26/2017, 4:50 PM   Note: This dictation was prepared with Dragon dictation along with smaller phrase technology. Any transcriptional errors that result from this process are unintentional.

## 2017-06-26 NOTE — Progress Notes (Signed)
[facility] - [place] at [facility]   PATIENT NAME: Raymond Haney    MR#:  [id number]  DATE OF BIRTH:  08-Sep-1980  SUBJECTIVE:  CHIEF COMPLAINT:   Chief Complaint  Patient presents with  . Chest Pain  To complain of upper abdominal pain, no better compared to yesterday, has been persistent  REVIEW OF SYSTEMS:  CONSTITUTIONAL: No fever, fatigue or weakness.  EYES: No blurred or double vision.  EARS, NOSE, AND THROAT: No tinnitus or ear pain.  RESPIRATORY: No cough, shortness of breath, wheezing or hemoptysis.  CARDIOVASCULAR: No chest pain, orthopnea, edema.  GASTROINTESTINAL: No nausea, vomiting, diarrhea or abdominal pain.  GENITOURINARY: No dysuria, hematuria.  ENDOCRINE: No polyuria, nocturia,  HEMATOLOGY: No anemia, easy bruising or bleeding SKIN: No rash or lesion. MUSCULOSKELETAL: No joint pain or arthritis.   NEUROLOGIC: No tingling, numbness, weakness.  PSYCHIATRY: No anxiety or depression.   ROS  DRUG ALLERGIES:  No Known Allergies  VITALS:  Blood pressure (!) 157/106, pulse 92, temperature 98.6 F (37 C), temperature source Oral, resp. rate 18, height 5\' 9"  (1.753 m), weight 102.7 kg (226 lb 6.4 oz), SpO2 100 %.  PHYSICAL EXAMINATION:  GENERAL:  37 y.o.-year-old patient lying in the bed with no acute distress.  EYES: Pupils equal, round, reactive to light and accommodation. No scleral icterus. Extraocular muscles intact.  HEENT: Head atraumatic, normocephalic. Oropharynx and nasopharynx clear.  NECK:  Supple, no jugular venous distention. No thyroid enlargement, no tenderness.  LUNGS: Normal breath sounds bilaterally, no wheezing, rales,rhonchi or crepitation. No use of accessory muscles of respiration.  CARDIOVASCULAR: S1, S2 normal. No murmurs, rubs, or gallops.  ABDOMEN: Epigastric tenderness, no rebound, nondistended. Bowel sounds present. No organomegaly or mass.  EXTREMITIES: No pedal edema, cyanosis, or clubbing.  NEUROLOGIC: Cranial  nerves II through XII are intact. Muscle strength 5/5 in all extremities. Sensation intact. Gait not checked.  PSYCHIATRIC: The patient is alert and oriented x 3.  SKIN: No obvious rash, lesion, or ulcer.   Physical Exam LABORATORY PANEL:   CBC Recent Labs  Lab 06/26/17 0332  WBC 14.1*  HGB 14.4  HCT 43.0  PLT 365   ------------------------------------------------------------------------------------------------------------------  Chemistries  Recent Labs  Lab 06/25/17 1540 06/26/17 0332  NA 139 140  K 3.5 3.6  CL 104 106  CO2 25 23  GLUCOSE 85 143*  BUN 9 11  CREATININE 0.96 0.99  CALCIUM 9.0 8.8*  AST 27  --   ALT 23  --   ALKPHOS 91  --   BILITOT 0.8  --    ------------------------------------------------------------------------------------------------------------------  Cardiac Enzymes Recent Labs  Lab 06/25/17 1540 06/26/17 0813  TROPONINI <0.03 <0.03   ------------------------------------------------------------------------------------------------------------------  RADIOLOGY:  Dg Chest 2 View  Result Date: 06/25/2017 CLINICAL DATA:  Chest pain and shortness of breath. History of asthma. EXAM: CHEST - 2 VIEW COMPARISON:  None. FINDINGS: The heart size and mediastinal contours are within normal limits. Both lungs are clear. The visualized skeletal structures are unremarkable. IMPRESSION: No active cardiopulmonary disease. Electronically Signed   By: Elsie StainJohn T Curnes M.D.   On: 06/25/2017 16:01   Ct Angio Chest Pe W And/or Wo Contrast  Result Date: 06/25/2017 CLINICAL DATA:  Chest pain and shortness of breath. History of asthma. EXAM: CT ANGIOGRAPHY CHEST WITH CONTRAST TECHNIQUE: Multidetector CT imaging of the chest was performed using the standard protocol during bolus administration of intravenous contrast. Multiplanar CT image reconstructions and MIPs were obtained to evaluate the vascular anatomy. CONTRAST:  75mL OMNIPAQUE  IOHEXOL 350 MG/ML SOLN COMPARISON:   Chest x-ray from same day. FINDINGS: Cardiovascular: Suboptimal opacification of the segmental pulmonary arteries due to contrast bolus timing. Evaluation is also limited due to motion artifact. No central or lobar pulmonary embolism. Normal heart size. No pericardial effusion. Normal caliber thoracic aorta. Mediastinum/Nodes: No enlarged mediastinal, hilar, or axillary lymph nodes. Thyroid gland, trachea, and esophagus demonstrate no significant findings. Lungs/Pleura: Lungs are clear. No pleural effusion or pneumothorax. No suspicious pulmonary nodule. Upper Abdomen: No acute abnormality. Hepatic steatosis. 1.8 cm round, enhancing lesion in the spleen. Musculoskeletal: Bilateral gynecomastia. No acute or significant osseous findings. Review of the MIP images confirms the above findings. IMPRESSION: 1. No central or lobar pulmonary embolism. Evaluation of the segmental pulmonary arteries is limited due to suboptimal opacification and motion artifact. 2.  No acute intrathoracic process. 3. Hepatic steatosis. 4. Indeterminate 1.8 cm enhancing lesion in the spleen. Recommend follow-up contrast-enhanced MRI of the abdomen in 6 months for further evaluation. This recommendation follows ACR consensus guidelines: White Paper of the ACR Incidental Findings Committee II on Splenic and Nodal Findings. J Am Coll Radiol [phone]. Electronically Signed   By: Obie Dredge M.D.   On: 06/25/2017 19:36    ASSESSMENT AND PLAN:  1 acute persistent epigastric pain, 4-5 days ? PUD, liver dz Has ruled out for ACS CTC neg for PE/hepatic steatosis/1.8 cm splenic lesion which MRI of the abdomen was recommended in 6 months for reevaluation We will treat with Protonix IV twice daily, GI cocktail, consult gastroenterology for expert opinion, check lactic acid level, sed rate, liver ultrasound, hepatitis panel, adult pain protocol, follow-up on echocardiogram, I continue close medical  2 chronic asthma without  exacerbation Stable  Continue BTs prn    3 chronic benign essential HTN Stable on current regiment  4 acute abnormal CT chest Noted per above Plan of care per above  All the records are reviewed and case discussed with Care Management/Social Workerr. Management plans discussed with the patient, family and they are in agreement.  CODE STATUS: full  TOTAL TIME TAKING CARE OF THIS PATIENT: 35 minutes.     POSSIBLE D/C IN 1-2 DAYS, DEPENDING ON CLINICAL CONDITION.   Evelena Asa Bona Hubbard M.D on 06/26/2017   Between 7am to 6pm - Pager - [phone]  After 6pm go to [email or web address] - password [email or web address]  [facility] [place] [facility]  Office  [phone]  CC: Primary care physician; System, Pcp Not In  Note: This dictation was prepared with Dragon dictation along with smaller phrase technology. Any transcriptional errors that result from this process are unintentional.

## 2017-06-26 NOTE — Plan of Care (Signed)
  Problem: Health Behavior/Discharge Planning: Goal: Ability to manage health-related needs will improve Outcome: Progressing   Problem: Clinical Measurements: Goal: Ability to maintain clinical measurements within normal limits will improve Outcome: Progressing Goal: Respiratory complications will improve Outcome: Progressing   

## 2017-06-26 NOTE — Progress Notes (Signed)
Initial Nutrition Assessment  DOCUMENTATION CODES:   Obesity unspecified  INTERVENTION:   Premier Protein BID, each supplement provides 160 kcal and 30 grams of protein.   MVI daily  Liberalize diet   NUTRITION DIAGNOSIS:   Unintentional weight loss related to social / environmental circumstances as evidenced by 10 percent weight loss in 2 months.  GOAL:   Patient will meet greater than or equal to 90% of their needs  MONITOR:   PO intake, Supplement acceptance, Labs, Weight trends, I & O's  REASON FOR ASSESSMENT:   Malnutrition Screening Tool    ASSESSMENT:   37 y.o. male with a known history of asthma, hypertension, parathyroidectomy admitted with epigastric pain   Met with pt in room today. Pt reports decreased appetite and oral intake over the past few months. Pt reports that he lost his father at the end of last year and that he has been depressed since then. Pt reports a 25lb(10%) wt loss over the past month; this is significant. Pt reports that he has still been eating, just not as much. Pt has not eaten much in hospital other than a few bites of a hamburger as he has been NPO for diagnostic procedures. RD will order chocolate Premier Protein and liberalize diet.   Medications reviewed and include: heparin, protonix, carafate, morphine, tramadol  Labs reviewed: Ca 8.8(L) Wbc- 14.1(H)  Nutrition-Focused physical exam completed. Findings are no fat depletion, no muscle depletion, and no edema.   Diet Order:  Diet regular Room service appropriate? Yes; Fluid consistency: Thin  EDUCATION NEEDS:   Education needs have been addressed  Skin:  Reviewed RN Assessment  Last BM:  3/31  Height:   Ht Readings from Last 1 Encounters:  06/25/17 5' 9"  (1.753 m)    Weight:   Wt Readings from Last 1 Encounters:  06/25/17 226 lb 6.4 oz (102.7 kg)    Ideal Body Weight:  72.7 kg  BMI:  Body mass index is 33.43 kg/m.  Estimated Nutritional Needs:   Kcal:   2100-2400kcal/day   Protein:  103-113g/day   Fluid:  >2.1L/day   Koleen Distance MS, RD, LDN Pager #[phone] After Hours Pager: [phone]

## 2017-06-26 NOTE — Progress Notes (Signed)
*  PRELIMINARY RESULTS* Echocardiogram 2D Echocardiogram has been performed.  Raymond GulaJoan M Siddarth Haney 06/26/2017, 10:16 AM

## 2017-06-26 NOTE — Progress Notes (Addendum)
CRITICAL VALUE ALERT  Critical Value:  Lactid acid 3.1  Date & Time Notied:  06/26/17.1502  Provider Notified: Dr. Katheren ShamsSalary  Orders Received/Actions taken: n/a

## 2017-06-27 LAB — HEPATITIS PANEL, ACUTE
HCV Ab: <0.1 s/co ratio (ref 0.0–0.9)
HEP B C IGM: NEGATIVE
Hep A IgM: NEGATIVE
Hepatitis B Surface Ag: NEGATIVE

## 2017-06-27 LAB — ECHOCARDIOGRAM COMPLETE
HEIGHTINCHES: 69 in
Weight: 3622.4 oz

## 2017-06-27 LAB — HIV ANTIBODY (ROUTINE TESTING W REFLEX): HIV Screen 4th Generation wRfx: NONREACTIVE

## 2017-06-27 MED ORDER — ACETAMINOPHEN 325 MG PO TABS: 650.0000 mg | ORAL_TABLET | Freq: Four times a day (QID) | ORAL | 0 refills | Status: AC | PRN

## 2017-06-27 MED ORDER — IBUPROFEN 400 MG PO TABS: 800.0000 mg | ORAL_TABLET | Freq: Three times a day (TID) | ORAL | 0 refills | Status: DC | PRN

## 2017-06-27 MED ORDER — AMLODIPINE BESYLATE 5 MG PO TABS: 5.0000 mg | ORAL_TABLET | Freq: Every day | ORAL | 0 refills | Status: AC

## 2017-06-27 MED ORDER — METOPROLOL TARTRATE 50 MG PO TABS: 50.0000 mg | ORAL_TABLET | Freq: Two times a day (BID) | ORAL | 0 refills | Status: DC

## 2017-06-27 MED ORDER — CYCLOBENZAPRINE HCL 5 MG PO TABS: 5.0000 mg | ORAL_TABLET | Freq: Three times a day (TID) | ORAL | 0 refills | Status: DC | PRN

## 2017-06-27 NOTE — Discharge Summary (Signed)
[facility] - [place] at [facility]   PATIENT NAME: Raymond Haney    MR#:  [id number]  DATE OF BIRTH:  July 11, 1980  DATE OF ADMISSION:  06/25/2017 ADMITTING PHYSICIAN: Altamese DillingVaibhavkumar Vachhani, MD  DATE OF DISCHARGE: No discharge date for patient encounter.  PRIMARY CARE PHYSICIAN: System, Pcp Not In    ADMISSION DIAGNOSIS:  Atypical chest pain [R07.89] Asthma with acute exacerbation, unspecified asthma severity, unspecified whether persistent [J45.901]  DISCHARGE DIAGNOSIS:  Principal Problem:   Ischemic chest pain Active Problems:   Chest pain   Muscular abdominal pain in epigastric region   SECONDARY DIAGNOSIS:   Past Medical History:  Diagnosis Date  . Asthma   . Hypertension     HOSPITAL COURSE:  1 acute persistent epigastric pain, 4-5 days duration Stable Most likely secondary to musculoskeletal etiology Patient was referred to the observation unit-did rule out for acute coronary syndrome, CT chest negative for PE/noted hepatic steatosis/1.8 cm splenic lesion which MRI of the abdomen was recommended in 6 months for reevaluation -patient advised to establish care to primary care provider in 1 week for reevaluation for continued medical management/care Patient was treated with Protonix IV twice daily, GI cocktail without improvement, lactic acid level normal, sed rate was normal, liver ultrasound was unimpressive, seen by gastroenterology-no intervention recommended as symptomatology was more consistent with musculoskeletal type pain-NSAID medication was recommended with muscle relaxants Echocardiogram was done-report still pending at the time of discharge, patient follow-up with primary care provider for results as stated above   2 chronic asthma without exacerbation Stable  Continued BTs prn                      3 chronic benign essential HTN Stable on current regiment  4 acute abnormal CT chest Noted per above Plan of care per  above  DISCHARGE CONDITIONS:  On the day of discharge patient is afebrile, hemogram stable, tolerating diet, ready for discharge home with follow-up with primary care provider to establish care for reevaluation, for more specific details please see chart  CONSULTS OBTAINED:    DRUG ALLERGIES:  No Known Allergies  DISCHARGE MEDICATIONS:   Allergies as of 06/27/2017   No Known Allergies     Medication List    TAKE these medications   acetaminophen 325 MG tablet Commonly known as:  TYLENOL Take 2 tablets (650 mg total) by mouth every 6 (six) hours as needed for mild pain.   albuterol (2.5 MG/3ML) 0.083% nebulizer solution Commonly known as:  PROVENTIL Take 3 mLs by nebulization every 6 (six) hours as needed.   albuterol 108 (90 Base) MCG/ACT inhaler Commonly known as:  PROVENTIL HFA;VENTOLIN HFA Inhale 2 puffs into the lungs every 6 (six) hours as needed.   amLODipine 5 MG tablet Commonly known as:  NORVASC Take 1 tablet (5 mg total) by mouth daily. Start taking on:  06/28/2017   cyclobenzaprine 5 MG tablet Commonly known as:  FLEXERIL Take 1 tablet (5 mg total) by mouth 3 (three) times daily as needed for muscle spasms.   ibuprofen 400 MG tablet Commonly known as:  ADVIL,MOTRIN Take 2 tablets (800 mg total) by mouth every 8 (eight) hours as needed for moderate pain.   metoprolol tartrate 50 MG tablet Commonly known as:  LOPRESSOR Take 1 tablet (50 mg total) by mouth 2 (two) times daily.   QUEtiapine 50 MG tablet Commonly known as:  SEROQUEL Take 1 tablet by mouth at bedtime.   traMADol 50  MG tablet Commonly known as:  ULTRAM Take 1 tablet by mouth every 4 (four) hours as needed.   valsartan 160 MG tablet Commonly known as:  DIOVAN Take 1 tablet by mouth daily.        DISCHARGE INSTRUCTIONS:  If you experience worsening of your admission symptoms, develop shortness of breath, life threatening emergency, suicidal or homicidal thoughts you must seek medical  attention immediately by calling 911 or calling your MD immediately  if symptoms less severe.  You Must read complete instructions/literature along with all the possible adverse reactions/side effects for all the Medicines you take and that have been prescribed to you. Take any new Medicines after you have completely understood and accept all the possible adverse reactions/side effects.   Please note  You were cared for by a hospitalist during your hospital stay. If you have any questions about your discharge medications or the care you received while you were in the hospital after you are discharged, you can call the unit and asked to speak with the hospitalist on call if the hospitalist that took care of you is not available. Once you are discharged, your primary care physician will handle any further medical issues. Please note that NO REFILLS for any discharge medications will be authorized once you are discharged, as it is imperative that you return to your primary care physician (or establish a relationship with a primary care physician if you do not have one) for your aftercare needs so that they can reassess your need for medications and monitor your lab values.    Today   CHIEF COMPLAINT:   Chief Complaint  Patient presents with  . Chest Pain    HISTORY OF PRESENT ILLNESS:  37 y.o. male with a known history of asthma, hypertension- started having chest pain which is all over the chest and getting worse withlaying flat in the bed and feels little better while sitting up.the pain is pressure-like and constant. 8-9 out of 10.Started having pain 2 days ago the day before he had done some heavy work of shoveling. Patient also have some shortness of breath and some wheezing associated with this. CT scan the chest was done in the ER which was noncontributory, patient's father had coronary disease and recent death so ER physician suggested to admit for further cardiac workup. VITAL SIGNS:   Blood pressure 115/73, pulse 79, temperature 98.2 F (36.8 C), temperature source Oral, resp. rate 18, height 5\' 9"  (1.753 m), weight 102.7 kg (226 lb 6.4 oz), SpO2 100 %.  I/O:    Intake/Output Summary (Last 24 hours) at 06/27/2017 1302 Last data filed at 06/27/2017 0700 Gross per 24 hour  Intake 0 ml  Output 2325 ml  Net -2325 ml    PHYSICAL EXAMINATION:  GENERAL:  37 y.o.-year-old patient lying in the bed with no acute distress.  EYES: Pupils equal, round, reactive to light and accommodation. No scleral icterus. Extraocular muscles intact.  HEENT: Head atraumatic, normocephalic. Oropharynx and nasopharynx clear.  NECK:  Supple, no jugular venous distention. No thyroid enlargement, no tenderness.  LUNGS: Normal breath sounds bilaterally, no wheezing, rales,rhonchi or crepitation. No use of accessory muscles of respiration.  CARDIOVASCULAR: S1, S2 normal. No murmurs, rubs, or gallops.  ABDOMEN: Soft, non-tender, non-distended. Bowel sounds present. No organomegaly or mass.  EXTREMITIES: No pedal edema, cyanosis, or clubbing.  NEUROLOGIC: Cranial nerves II through XII are intact. Muscle strength 5/5 in all extremities. Sensation intact. Gait not checked.  PSYCHIATRIC: The patient is alert  and oriented x 3.  SKIN: No obvious rash, lesion, or ulcer.   DATA REVIEW:   CBC Recent Labs  Lab 06/26/17 0332  WBC 14.1*  HGB 14.4  HCT 43.0  PLT 365    Chemistries  Recent Labs  Lab 06/25/17 1540 06/26/17 0332  NA 139 140  K 3.5 3.6  CL 104 106  CO2 25 23  GLUCOSE 85 143*  BUN 9 11  CREATININE 0.96 0.99  CALCIUM 9.0 8.8*  AST 27  --   ALT 23  --   ALKPHOS 91  --   BILITOT 0.8  --     Cardiac Enzymes Recent Labs  Lab 06/26/17 0813  TROPONINI <0.03    Microbiology Results  No results found for this or any previous visit.  RADIOLOGY:  Dg Chest 2 View  Result Date: 06/25/2017 CLINICAL DATA:  Chest pain and shortness of breath. History of asthma. EXAM: CHEST - 2 VIEW  COMPARISON:  None. FINDINGS: The heart size and mediastinal contours are within normal limits. Both lungs are clear. The visualized skeletal structures are unremarkable. IMPRESSION: No active cardiopulmonary disease. Electronically Signed   By: Elsie Stain M.D.   On: 06/25/2017 16:01   Ct Angio Chest Pe W And/or Wo Contrast  Result Date: 06/25/2017 CLINICAL DATA:  Chest pain and shortness of breath. History of asthma. EXAM: CT ANGIOGRAPHY CHEST WITH CONTRAST TECHNIQUE: Multidetector CT imaging of the chest was performed using the standard protocol during bolus administration of intravenous contrast. Multiplanar CT image reconstructions and MIPs were obtained to evaluate the vascular anatomy. CONTRAST:  75mL OMNIPAQUE IOHEXOL 350 MG/ML SOLN COMPARISON:  Chest x-ray from same day. FINDINGS: Cardiovascular: Suboptimal opacification of the segmental pulmonary arteries due to contrast bolus timing. Evaluation is also limited due to motion artifact. No central or lobar pulmonary embolism. Normal heart size. No pericardial effusion. Normal caliber thoracic aorta. Mediastinum/Nodes: No enlarged mediastinal, hilar, or axillary lymph nodes. Thyroid gland, trachea, and esophagus demonstrate no significant findings. Lungs/Pleura: Lungs are clear. No pleural effusion or pneumothorax. No suspicious pulmonary nodule. Upper Abdomen: No acute abnormality. Hepatic steatosis. 1.8 cm round, enhancing lesion in the spleen. Musculoskeletal: Bilateral gynecomastia. No acute or significant osseous findings. Review of the MIP images confirms the above findings. IMPRESSION: 1. No central or lobar pulmonary embolism. Evaluation of the segmental pulmonary arteries is limited due to suboptimal opacification and motion artifact. 2.  No acute intrathoracic process. 3. Hepatic steatosis. 4. Indeterminate 1.8 cm enhancing lesion in the spleen. Recommend follow-up contrast-enhanced MRI of the abdomen in 6 months for further evaluation. This  recommendation follows ACR consensus guidelines: White Paper of the ACR Incidental Findings Committee II on Splenic and Nodal Findings. J Am Coll Radiol [phone]. Electronically Signed   By: Obie Dredge M.D.   On: 06/25/2017 19:36   US Liver Doppler  Result Date: 06/26/2017 CLINICAL DATA:  Epigastric pain x3 days. Hepatic steatosis described on recent CT chest. EXAM: DUPLEX ULTRASOUND OF LIVER TECHNIQUE: Color and duplex Doppler ultrasound was performed to evaluate the hepatic in-flow and out-flow vessels. COMPARISON:  CT 06/25/2017 FINDINGS: Portal Vein 10 mm diameter. No evidence of occlusion or thrombus. Velocities (all hepatopetal): Main:  34-52 cm/sec Right:  29 cm/sec Left:  26 cm/sec Hepatic Vein Velocities (all hepatofugal): Right:  27 cm/sec Middle:  21 cm/sec Left:  20 cm/sec Hepatic Artery Velocity:  60 cm/sec Spleen 7.2 x 11 x 3.9 cm (volume = 160 cm^3). Splenic Vein: No evidence of occlusion or thrombus. Velocity:  14 cm/sec Varices: None identified Ascites: None IMPRESSION: 1. Unremarkable hepatic vascular Doppler evaluation. Electronically Signed   By: Corlis Leak M.D.   On: 06/26/2017 11:31    EKG:   Orders placed or performed during the hospital encounter of 06/25/17  . EKG 12-Lead  . EKG 12-Lead  . ED EKG within 10 minutes  . ED EKG within 10 minutes      Management plans discussed with the patient, family and they are in agreement.  CODE STATUS:     Code Status Orders  (From admission, onward)        Start     Ordered   06/25/17 2312  Full code  Continuous     06/25/17 2311    Code Status History    This patient has a current code status but no historical code status.      TOTAL TIME TAKING CARE OF THIS PATIENT: 45 minutes.    Evelena Asa Susy Placzek M.D on 06/27/2017 at 1:02 PM  Between 7am to 6pm - Pager - [phone]  After 6pm go to [email or web address] - password [email or web address]  [facility] [place] [facility]  Office  [phone]  CC: Primary care  physician; System, Pcp Not In   Note: This dictation was prepared with Dragon dictation along with smaller phrase technology. Any transcriptional errors that result from this process are unintentional.

## 2017-06-27 NOTE — Plan of Care (Signed)
  Problem: Pain Managment: Goal: General experience of comfort will improve Outcome: Not Progressing   Pt still complaining of shooting pain in chest, treated twice this shift with morphine with relief. Pt up independently in room, tolerating well. BP now controlled.  Vitals:   06/26/17 2302 06/27/17 0634  BP: (!) 133/97 115/87  Pulse: 79 76  Resp:  18  Temp:  98 F (36.7 C)  SpO2:  96%

## 2017-06-27 NOTE — Progress Notes (Signed)
Raymond FrankelMatthew S Haney to be D/C'd Home per MD order.  Discussed prescriptions and follow up appointments with the patient. Prescriptions given to patient, medication list explained in detail. Pt verbalized understanding.  Allergies as of 06/27/2017   No Known Allergies     Medication List    TAKE these medications   acetaminophen 325 MG tablet Commonly known as:  TYLENOL Take 2 tablets (650 mg total) by mouth every 6 (six) hours as needed for mild pain.   albuterol (2.5 MG/3ML) 0.083% nebulizer solution Commonly known as:  PROVENTIL Take 3 mLs by nebulization every 6 (six) hours as needed.   albuterol 108 (90 Base) MCG/ACT inhaler Commonly known as:  PROVENTIL HFA;VENTOLIN HFA Inhale 2 puffs into the lungs every 6 (six) hours as needed.   amLODipine 5 MG tablet Commonly known as:  NORVASC Take 1 tablet (5 mg total) by mouth daily. Start taking on:  06/28/2017   cyclobenzaprine 5 MG tablet Commonly known as:  FLEXERIL Take 1 tablet (5 mg total) by mouth 3 (three) times daily as needed for muscle spasms.   ibuprofen 400 MG tablet Commonly known as:  ADVIL,MOTRIN Take 2 tablets (800 mg total) by mouth every 8 (eight) hours as needed for moderate pain.   metoprolol tartrate 50 MG tablet Commonly known as:  LOPRESSOR Take 1 tablet (50 mg total) by mouth 2 (two) times daily.   QUEtiapine 50 MG tablet Commonly known as:  SEROQUEL Take 1 tablet by mouth at bedtime.   traMADol 50 MG tablet Commonly known as:  ULTRAM Take 1 tablet by mouth every 4 (four) hours as needed.   valsartan 160 MG tablet Commonly known as:  DIOVAN Take 1 tablet by mouth daily.       Vitals:   06/27/17 0634 06/27/17 0734  BP: 115/87 115/73  Pulse: 76 79  Resp: 18   Temp: 98 F (36.7 C) 98.2 F (36.8 C)  SpO2: 96% 100%    Tele box removed and returned. Skin clean, dry and intact without evidence of skin break down, no evidence of skin tears noted. IV catheter discontinued intact. Site without signs  and symptoms of complications. Dressing and pressure applied. Pt denies pain at this time. No complaints noted.  An After Visit Summary was printed and given to the patient. Patient escorted via WC, and D/C home via private auto.  Raymond NoelErica Y Tinya Haney

## 2017-08-27 ENCOUNTER — Encounter: Payer: Self-pay | Admitting: Emergency Medicine

## 2017-08-27 ENCOUNTER — Ambulatory Visit: Payer: BLUE CROSS/BLUE SHIELD

## 2017-08-27 ENCOUNTER — Ambulatory Visit
Admission: EM | Admit: 2017-08-27 | Discharge: 2017-08-27 | Disposition: A | Discharge status: Home / Self Care | Payer: BLUE CROSS/BLUE SHIELD | Attending: Family Medicine | Admitting: Family Medicine

## 2017-08-27 ENCOUNTER — Other Ambulatory Visit: Payer: Self-pay

## 2017-08-27 DIAGNOSIS — R062 Wheezing: Secondary | ICD-10-CM

## 2017-08-27 DIAGNOSIS — R0602 Shortness of breath: Secondary | ICD-10-CM | POA: Diagnosis not present

## 2017-08-27 DIAGNOSIS — J4541 Moderate persistent asthma with (acute) exacerbation: Secondary | ICD-10-CM

## 2017-08-27 MED ORDER — METHYLPREDNISOLONE SODIUM SUCC 125 MG IJ SOLR
125.0000 mg | Freq: Once | INTRAMUSCULAR | Status: AC
  Administered 2017-08-27: 125 mg via INTRAMUSCULAR

## 2017-08-27 MED ORDER — PREDNISONE 10 MG PO TABS: ORAL_TABLET | ORAL | 0 refills | Status: DC

## 2017-08-27 MED ORDER — IPRATROPIUM-ALBUTEROL 0.5-2.5 (3) MG/3ML IN SOLN
3.0000 mL | Freq: Four times a day (QID) | RESPIRATORY_TRACT | Status: DC
  Administered 2017-08-27: 3 mL via RESPIRATORY_TRACT

## 2017-08-27 NOTE — ED Triage Notes (Signed)
Patient states that he had an asthma attack that started this morning.  Patient c/o SOB.

## 2017-08-27 NOTE — Discharge Instructions (Addendum)
Please take prednisone taper as prescribed and continue with albuterol and Symbicort inhalers.  Please call pulmonologist and schedule follow-up appointment.  If any shortness of breath worsening symptoms or change in health please return to clinic or ED.

## 2017-08-27 NOTE — ED Provider Notes (Signed)
[facility]    CSN: [id number] Arrival date & time: 08/27/17  1747     History   Chief Complaint Chief Complaint  Patient presents with  . Shortness of Breath    HPI ABDULLOH Haney is a 37 y.o. male.   Who presents to the emergency department for evaluation of shortness of breath and wheezing.  Patient states he has had a cough for 4 weeks, last finished antibiotic 3 weeks ago and has had persistent dry cough.  He denies any chest pain but does feel shortness of breath or wheezing.  He is has a history of asthma and uses albuterol along with Symbicort.  Patient states he is around dust at work.  He denies any fevers, nausea or vomiting.  He is been treated a couple times with some steroids with some improvement.  HPI  Past Medical History:  Diagnosis Date  . Asthma   . Hypertension     Patient Active Problem List   Diagnosis Date Noted  . Muscular abdominal pain in epigastric region   . Ischemic chest pain 06/25/2017  . Chest pain 06/25/2017    Past Surgical History:  Procedure Laterality Date  . PARATHYROIDECTOMY         Home Medications    Prior to Admission medications   Medication Sig Start Date End Date Taking? Authorizing Provider  albuterol (PROVENTIL HFA;VENTOLIN HFA) 108 (90 Base) MCG/ACT inhaler Inhale 2 puffs into the lungs every 6 (six) hours as needed. 05/28/17  Yes [provider]  ALPRAZolam Prudy Feeler) 0.25 MG tablet Take by mouth. 08/08/17 09/07/17 Yes [provider]  amLODipine (NORVASC) 5 MG tablet Take 1 tablet (5 mg total) by mouth daily. 06/28/17  Yes Salary, Evelena Asa, MD  budesonide-formoterol (SYMBICORT) 160-4.5 MCG/ACT inhaler Inhale into the lungs. 07/21/16  Yes [provider]  buPROPion (WELLBUTRIN XL) 150 MG 24 hr tablet Take by mouth. 08/08/17 08/08/18 Yes [provider]  cyclobenzaprine (FLEXERIL) 5 MG tablet Take 1 tablet (5 mg total) by mouth 3 (three) times daily as needed for muscle spasms.  06/27/17  Yes Salary, Evelena Asa, MD  metoprolol tartrate (LOPRESSOR) 50 MG tablet Take 1 tablet (50 mg total) by mouth 2 (two) times daily. 06/27/17  Yes Salary, Evelena Asa, MD  QUEtiapine (SEROQUEL) 50 MG tablet Take 1 tablet by mouth at bedtime. 06/18/17  Yes [provider]  valsartan (DIOVAN) 160 MG tablet Take 1 tablet by mouth daily. 05/22/17  Yes [provider]  acetaminophen (TYLENOL) 325 MG tablet Take 2 tablets (650 mg total) by mouth every 6 (six) hours as needed for mild pain. 06/27/17   Salary, Jetty Duhamel D, MD  albuterol (PROVENTIL) (2.5 MG/3ML) 0.083% nebulizer solution Take 3 mLs by nebulization every 6 (six) hours as needed. 05/08/17   [provider]  ibuprofen (ADVIL,MOTRIN) 400 MG tablet Take 2 tablets (800 mg total) by mouth every 8 (eight) hours as needed for moderate pain. 06/27/17   Salary, Evelena Asa, MD  predniSONE (DELTASONE) 10 MG tablet 10 day taper. 5,5,4,4,3,3,2,2,1,1 08/27/17   Evon Slack, PA-C  traMADol (ULTRAM) 50 MG tablet Take 1 tablet by mouth every 4 (four) hours as needed. 06/03/17   [provider]    Family History Family History  Problem Relation Age of Onset  . CAD Father     Social History Social History   Tobacco Use  . Smoking status: Never Smoker  . Smokeless tobacco: Never Used  Substance Use Topics  .  Alcohol use: Not Currently  . Drug use: Not Currently     Allergies   Patient has no known allergies.   Review of Systems Review of Systems  Constitutional: Negative.  Negative for activity change, appetite change, chills and fever.  HENT: Negative for congestion, ear pain, mouth sores, rhinorrhea, sinus pressure, sore throat and trouble swallowing.   Respiratory: Positive for cough, shortness of breath and wheezing. Negative for chest tightness.   Cardiovascular: Negative for chest pain and leg swelling.  Gastrointestinal: Negative for abdominal distention, abdominal pain, diarrhea, nausea and vomiting.    Musculoskeletal: Negative for arthralgias, back pain and gait problem.  Skin: Negative for color change and rash.  Neurological: Negative for dizziness and headaches.  Hematological: Negative for adenopathy.  Psychiatric/Behavioral: Negative for agitation and behavioral problems.     Physical Exam Triage Vital Signs ED Triage Vitals  Enc Vitals Group     BP 08/27/17 1805 (!) 138/110     Pulse Rate 08/27/17 1805 100     Resp 08/27/17 1805 16     Temp 08/27/17 1805 98.5 F (36.9 C)     Temp Source 08/27/17 1805 Oral     SpO2 08/27/17 1805 100 %     Weight 08/27/17 1802 240 lb (108.9 kg)     Height 08/27/17 1802 5\' 9"  (1.753 m)     Head Circumference --      Peak Flow --      Pain Score 08/27/17 1802 7     Pain Loc --      Pain Edu? --      Excl. in GC? --    No data found.  Updated Vital Signs BP (!) 142/96 (BP Location: Right Arm)   Pulse 100   Temp 98.5 F (36.9 C) (Oral)   Resp 16   Ht 5\' 9"  (1.753 m)   Wt 240 lb (108.9 kg)   SpO2 100%   BMI 35.44 kg/m   Visual Acuity Right Eye Distance:   Left Eye Distance:   Bilateral Distance:    Right Eye Near:   Left Eye Near:    Bilateral Near:     Physical Exam  Constitutional: He is oriented to person, place, and time. He appears well-developed and well-nourished.  HENT:  Head: Normocephalic and atraumatic.  Eyes: Pupils are equal, round, and reactive to light. Conjunctivae and EOM are normal.  Neck: Normal range of motion. Neck supple.  Cardiovascular: Normal rate, regular rhythm, normal heart sounds and intact distal pulses.  Pulmonary/Chest: Effort normal. No respiratory distress. He has decreased breath sounds. He has wheezes in the right upper field and the left upper field. He has no rhonchi. He has no rales. He exhibits no tenderness.  Abdominal: Soft. Bowel sounds are normal. He exhibits no distension. There is no tenderness.  Musculoskeletal: Normal range of motion. He exhibits no edema or tenderness.   Neurological: He is alert and oriented to person, place, and time.  Skin: Skin is warm and dry.  Psychiatric: He has a normal mood and affect. His behavior is normal. Judgment and thought content normal.     UC Treatments / Results  Labs (all labs ordered are listed, but only abnormal results are displayed) Labs Reviewed - No data to display  EKG None  Radiology Dg Chest 2 View  Result Date: 08/27/2017 CLINICAL DATA:  Cough for 2 weeks. EXAM: CHEST - 2 VIEW COMPARISON:  Radiographs and CT 06/25/2017 FINDINGS: The cardiomediastinal contours are normal. The lungs  are clear. Pulmonary vasculature is normal. No consolidation, pleural effusion, or pneumothorax. No acute osseous abnormalities are seen. IMPRESSION: No acute pulmonary process. Electronically Signed   By: Rubye Oaks M.D.   On: 08/27/2017 19:03    Procedures Procedures (including critical care time)  Medications Ordered in UC Medications  ipratropium-albuterol (DUONEB) 0.5-2.5 (3) MG/3ML nebulizer solution 3 mL (3 mLs Nebulization Given 08/27/17 1903)  methylPREDNISolone sodium succinate (SOLU-MEDROL) 125 mg/2 mL injection 125 mg (125 mg Intramuscular Given 08/27/17 1928)    Initial Impression / Assessment and Plan / UC Course  I have reviewed the triage vital signs and the nursing notes.  Pertinent labs & imaging results that were available during my care of the patient were reviewed by me and considered in my medical decision making (see chart for details).     37 year old male with history of asthma comes in today with shortness of breath and wheezing.  Has had a cough for at least 4 weeks that did not respond well to antibiotics but has had a steroid taper that improved his symptoms.  Chest x-ray negative for any acute cardiopulmonary process.  Patient is slightly hypertensive but otherwise vital signs are stable.  He is not tachycardic and oxygen saturation is 100%.  Patient without chest pain.  Patient given DuoNeb  treatment which significantly increased breath sounds and resolved his wheezing.  He is also given Solu-Medrol.  He is sent home with prednisone taper and will continue with albuterol and Symbicort.  Patient encouraged to follow-up with pulmonologist. Final Clinical Impressions(s) / UC Diagnoses   Final diagnoses:  Moderate persistent asthma with exacerbation  SOB (shortness of breath)  Wheezing     Discharge Instructions     Please take prednisone taper as prescribed and continue with albuterol and Symbicort inhalers.  Please call pulmonologist and schedule follow-up appointment.  If any shortness of breath worsening symptoms or change in health please return to clinic or ED.   ED Prescriptions    Medication Sig Dispense Auth. Provider   predniSONE (DELTASONE) 10 MG tablet  (Status: Discontinued) 10 day taper. 5,5,4,4,3,3,2,2,1,1 30 tablet Evon Slack, PA-C   predniSONE (DELTASONE) 10 MG tablet 10 day taper. 5,5,4,4,3,3,2,2,1,1 30 tablet Ronnette Juniper       Evon Slack, [place] 08/27/17 0981

## 2017-08-30 ENCOUNTER — Ambulatory Visit
Admission: EM | Admit: 2017-08-30 | Discharge: 2017-08-30 | Disposition: A | Discharge status: Home / Self Care | Payer: BLUE CROSS/BLUE SHIELD | Attending: Family Medicine | Admitting: Family Medicine

## 2017-08-30 ENCOUNTER — Encounter: Payer: Self-pay | Admitting: Emergency Medicine

## 2017-08-30 ENCOUNTER — Other Ambulatory Visit: Payer: Self-pay

## 2017-08-30 DIAGNOSIS — J45901 Unspecified asthma with (acute) exacerbation: Secondary | ICD-10-CM

## 2017-08-30 MED ORDER — MONTELUKAST SODIUM 10 MG PO TABS: 10.0000 mg | ORAL_TABLET | Freq: Every day | ORAL | 0 refills | Status: AC

## 2017-08-30 NOTE — ED Triage Notes (Signed)
Patient states that he is still having SOB.  Patient was seen here on Monday for the same thing and has been on Prednisone.  Patient reports he had another asthtma attack this morning.

## 2017-08-30 NOTE — Discharge Instructions (Signed)
Continue the prednisone, symbicort and albuterol.  Start the singulair.  Call pulmonology for an appt.  Take care  Dr. Adriana Simasook

## 2017-08-30 NOTE — ED Provider Notes (Signed)
[facility]  CSN: [id number] Arrival date & time: 08/30/17  1456  History   Chief Complaint Chief Complaint  Patient presents with  . Shortness of Breath   HPI  37 year old male with asthma who is followed by pulmonology presents with continued shortness of breath and wheezing.  Patient was seen on 6/3.  He was placed on prednisone.  Patient states that he has not improved significantly.  He continues to be short of breath.  Continues to wheeze.  Worse at night.  Patient has recently been in the hospital and had a cardiac evaluation which was unremarkable.  Normal echo.  No reports of PND orthopnea.  He endorses compliance with Symbicort.  He states that he has been using albuterol frequently.  He has not seen pulmonology in quite some time.  No other associated symptoms.  No other complaints.  Past Medical History:  Diagnosis Date  . Asthma   . Hypertension     Patient Active Problem List   Diagnosis Date Noted  . Muscular abdominal pain in epigastric region   . Chest pain 06/25/2017   Past Surgical History:  Procedure Laterality Date  . PARATHYROIDECTOMY     Home Medications    Prior to Admission medications   Medication Sig Start Date End Date Taking? Authorizing Provider  acetaminophen (TYLENOL) 325 MG tablet Take 2 tablets (650 mg total) by mouth every 6 (six) hours as needed for mild pain. 06/27/17   Salary, Evelena Asa, MD  albuterol (PROVENTIL HFA;VENTOLIN HFA) 108 (90 Base) MCG/ACT inhaler Inhale 2 puffs into the lungs every 6 (six) hours as needed. 05/28/17   [provider]  albuterol (PROVENTIL) (2.5 MG/3ML) 0.083% nebulizer solution Take 3 mLs by nebulization every 6 (six) hours as needed. 05/08/17   [provider]  ALPRAZolam Prudy Feeler) 0.25 MG tablet Take by mouth. 08/08/17 09/07/17  [provider]  amLODipine (NORVASC) 5 MG tablet Take 1 tablet (5 mg total) by mouth daily. 06/28/17   Salary, Evelena Asa, MD  budesonide-formoterol  (SYMBICORT) 160-4.5 MCG/ACT inhaler Inhale into the lungs. 07/21/16   [provider]  buPROPion (WELLBUTRIN XL) 150 MG 24 hr tablet Take by mouth. 08/08/17 08/08/18  [provider]  cyclobenzaprine (FLEXERIL) 5 MG tablet Take 1 tablet (5 mg total) by mouth 3 (three) times daily as needed for muscle spasms. 06/27/17   Salary, Evelena Asa, MD  ibuprofen (ADVIL,MOTRIN) 400 MG tablet Take 2 tablets (800 mg total) by mouth every 8 (eight) hours as needed for moderate pain. 06/27/17   Salary, Evelena Asa, MD  metoprolol tartrate (LOPRESSOR) 50 MG tablet Take 1 tablet (50 mg total) by mouth 2 (two) times daily. 06/27/17   Salary, Evelena Asa, MD  montelukast (SINGULAIR) 10 MG tablet Take 1 tablet (10 mg total) by mouth at bedtime. 08/30/17   Tommie Sams, DO  predniSONE (DELTASONE) 10 MG tablet 10 day taper. 5,5,4,4,3,3,2,2,1,1 08/27/17   Evon Slack, PA-C  QUEtiapine (SEROQUEL) 50 MG tablet Take 1 tablet by mouth at bedtime. 06/18/17   [provider]  traMADol (ULTRAM) 50 MG tablet Take 1 tablet by mouth every 4 (four) hours as needed. 06/03/17   [provider]  valsartan (DIOVAN) 160 MG tablet Take 1 tablet by mouth daily. 05/22/17   [provider]    Family History Family History  Problem Relation Age of Onset  . CAD Father     Social History Social History   Tobacco Use  . Smoking status: Never  Smoker  . Smokeless tobacco: Never Used  Substance Use Topics  . Alcohol use: Not Currently  . Drug use: Not Currently     Allergies   Patient has no known allergies.   Review of Systems Review of Systems  Constitutional: Negative for fever.  Respiratory: Positive for cough, chest tightness, shortness of breath and wheezing.    Physical Exam Triage Vital Signs ED Triage Vitals  Enc Vitals Group     BP 08/30/17 1506 (S) (!) 144/109     Pulse Rate 08/30/17 1506 92     Resp 08/30/17 1506 16     Temp 08/30/17 1506 98.5 F (36.9 C)     Temp Source  08/30/17 1506 Oral     SpO2 08/30/17 1506 100 %     Weight 08/30/17 1504 240 lb (108.9 kg)     Height 08/30/17 1504 5\' 9"  (1.753 m)     Head Circumference --      Peak Flow --      Pain Score 08/30/17 1504 0     Pain Loc --      Pain Edu? --      Excl. in GC? --    Updated Vital Signs BP (S) (!) 144/109 (BP Location: Left Arm)   Pulse 92   Temp 98.5 F (36.9 C) (Oral)   Resp 16   Ht 5\' 9"  (1.753 m)   Wt 240 lb (108.9 kg)   SpO2 100%   BMI 35.44 kg/m   Physical Exam  Constitutional: He is oriented to person, place, and time. He appears well-developed. No distress.  HENT:  Head: Normocephalic and atraumatic.  Cardiovascular: Normal rate and regular rhythm.  No LE edema.  Pulmonary/Chest: Effort normal and breath sounds normal. He has no wheezes. He has no rales.  Neurological: He is alert and oriented to person, place, and time.  Psychiatric: He has a normal mood and affect. His behavior is normal.  Nursing note and vitals reviewed.  UC Treatments / Results  Labs (all labs ordered are listed, but only abnormal results are displayed) Labs Reviewed - No data to display  EKG None  Radiology No results found.  Procedures Procedures (including critical care time)  Medications Ordered in UC Medications - No data to display  Initial Impression / Assessment and Plan / UC Course  I have reviewed the triage vital signs and the nursing notes.  Pertinent labs & imaging results that were available during my care of the patient were reviewed by me and considered in my medical decision making (see chart for details).    37 year old male presents with continued shortness of breath and wheezing.  Patient experiencing ongoing asthma exacerbation.  He is on appropriate treatment.  Adding Singulair to his regimen.  Advised him to follow-up with pulmonology.  Continue Symbicort, albuterol, prednisone.  Final Clinical Impressions(s) / UC Diagnoses   Final diagnoses:    Exacerbation of persistent asthma, unspecified asthma severity     Discharge Instructions     Continue the prednisone, symbicort and albuterol.  Start the singulair.  Call pulmonology for an appt.  Take care  Dr. Adriana Simasook    ED Prescriptions    Medication Sig Dispense Auth. Provider   montelukast (SINGULAIR) 10 MG tablet Take 1 tablet (10 mg total) by mouth at bedtime. 90 tablet Tommie Samsook, Smantha Boakye G, DO     Controlled Substance Prescriptions [place] Controlled Substance Registry consulted? Not Applicable   Tommie SamsCook, Asher Babilonia G, DO 08/30/17 1627

## 2017-09-04 ENCOUNTER — Emergency Department
Admission: EM | Admit: 2017-09-04 | Discharge: 2017-09-05 | Disposition: A | Discharge status: Home / Self Care | Payer: BLUE CROSS/BLUE SHIELD | Attending: Emergency Medicine | Admitting: Emergency Medicine

## 2017-09-04 ENCOUNTER — Other Ambulatory Visit: Payer: Self-pay

## 2017-09-04 DIAGNOSIS — I10 Essential (primary) hypertension: Secondary | ICD-10-CM | POA: Insufficient documentation

## 2017-09-04 DIAGNOSIS — Z79899 Other long term (current) drug therapy: Secondary | ICD-10-CM | POA: Diagnosis not present

## 2017-09-04 DIAGNOSIS — R52 Pain, unspecified: Secondary | ICD-10-CM | POA: Diagnosis not present

## 2017-09-04 DIAGNOSIS — R609 Edema, unspecified: Secondary | ICD-10-CM | POA: Diagnosis not present

## 2017-09-04 DIAGNOSIS — J45909 Unspecified asthma, uncomplicated: Secondary | ICD-10-CM | POA: Insufficient documentation

## 2017-09-04 DIAGNOSIS — N644 Mastodynia: Secondary | ICD-10-CM | POA: Diagnosis present

## 2017-09-04 NOTE — ED Triage Notes (Signed)
Pt in with co left breast pain swelling and tenderness for 1 week.

## 2017-09-05 ENCOUNTER — Emergency Department: Payer: BLUE CROSS/BLUE SHIELD

## 2017-09-05 MED ORDER — TRAMADOL HCL 50 MG PO TABS: 50.0000 mg | ORAL_TABLET | Freq: Four times a day (QID) | ORAL | 0 refills | Status: AC | PRN

## 2017-09-05 MED ORDER — DOXYCYCLINE HYCLATE 100 MG PO TABS: 100.0000 mg | ORAL_TABLET | Freq: Two times a day (BID) | ORAL | Status: DC

## 2017-09-05 NOTE — ED Provider Notes (Signed)
[facility] Emergency Department Provider Note    First MD Initiated Contact with Patient 09/05/17 0019     (approximate)  I have reviewed the triage vital signs and the nursing notes.   HISTORY  Chief Complaint Breast Pain   HPI Raymond Haney is a 37 y.o. male with below list of chronic medical conditions presents to the emergency department with nontraumatic left breast pain times few months with progressive worsening over the past week.  Patient states that he has a tender swollen area in that left breast which he has noticed for months.  Patient denies any fever.  Patient denies any drainage or overlying skin changes to the left breast.  Patient states current pain score 7 out of 10.  It is worsened with any palpation of the area.  Past Medical History:  Diagnosis Date  . Asthma   . Hypertension     Patient Active Problem List   Diagnosis Date Noted  . Muscular abdominal pain in epigastric region   . Chest pain 06/25/2017    Past Surgical History:  Procedure Laterality Date  . PARATHYROIDECTOMY      Prior to Admission medications   Medication Sig Start Date End Date Taking? Authorizing Provider  acetaminophen (TYLENOL) 325 MG tablet Take 2 tablets (650 mg total) by mouth every 6 (six) hours as needed for mild pain. 06/27/17   Salary, Evelena Asa, MD  albuterol (PROVENTIL HFA;VENTOLIN HFA) 108 (90 Base) MCG/ACT inhaler Inhale 2 puffs into the lungs every 6 (six) hours as needed. 05/28/17   [provider]  albuterol (PROVENTIL) (2.5 MG/3ML) 0.083% nebulizer solution Take 3 mLs by nebulization every 6 (six) hours as needed. 05/08/17   [provider]  ALPRAZolam Prudy Feeler) 0.25 MG tablet Take by mouth. 08/08/17 09/07/17  [provider]  amLODipine (NORVASC) 5 MG tablet Take 1 tablet (5 mg total) by mouth daily. 06/28/17   Salary, Evelena Asa, MD  budesonide-formoterol (SYMBICORT) 160-4.5 MCG/ACT inhaler Inhale into the lungs. 07/21/16    [provider]  buPROPion (WELLBUTRIN XL) 150 MG 24 hr tablet Take by mouth. 08/08/17 08/08/18  [provider]  cyclobenzaprine (FLEXERIL) 5 MG tablet Take 1 tablet (5 mg total) by mouth 3 (three) times daily as needed for muscle spasms. 06/27/17   Salary, Evelena Asa, MD  ibuprofen (ADVIL,MOTRIN) 400 MG tablet Take 2 tablets (800 mg total) by mouth every 8 (eight) hours as needed for moderate pain. 06/27/17   Salary, Evelena Asa, MD  metoprolol tartrate (LOPRESSOR) 50 MG tablet Take 1 tablet (50 mg total) by mouth 2 (two) times daily. 06/27/17   Salary, Evelena Asa, MD  montelukast (SINGULAIR) 10 MG tablet Take 1 tablet (10 mg total) by mouth at bedtime. 08/30/17   Tommie Sams, DO  predniSONE (DELTASONE) 10 MG tablet 10 day taper. 5,5,4,4,3,3,2,2,1,1 08/27/17   Evon Slack, PA-C  QUEtiapine (SEROQUEL) 50 MG tablet Take 1 tablet by mouth at bedtime. 06/18/17   [provider]  traMADol (ULTRAM) 50 MG tablet Take 1 tablet by mouth every 4 (four) hours as needed. 06/03/17   [provider]  traMADol (ULTRAM) 50 MG tablet Take 1 tablet (50 mg total) by mouth every 6 (six) hours as needed. 09/05/17 09/05/18  Darci Current, MD  valsartan (DIOVAN) 160 MG tablet Take 1 tablet by mouth daily. 05/22/17   [provider]    Allergies No known drug allergies  Family History  Problem Relation Age of Onset  .  CAD Father     Social History Social History   Tobacco Use  . Smoking status: Never Smoker  . Smokeless tobacco: Never Used  Substance Use Topics  . Alcohol use: Not Currently  . Drug use: Not Currently    Review of Systems Constitutional: No fever/chills Eyes: No visual changes. ENT: No sore throat. Cardiovascular: Denies chest pain. Respiratory: Denies shortness of breath. Gastrointestinal: No abdominal pain.  No nausea, no vomiting.  No diarrhea.  No constipation. Genitourinary: Negative for dysuria. Musculoskeletal: Negative for neck pain.   Negative for back pain.  Positive for left breast pain. Integumentary: Negative for rash. Neurological: Negative for headaches, focal weakness or numbness.   ____________________________________________   PHYSICAL EXAM:  VITAL SIGNS: ED Triage Vitals  Enc Vitals Group     BP 09/04/17 2316 (!) 154/130     Pulse Rate 09/04/17 2316 98     Resp 09/04/17 2316 18     Temp 09/04/17 2316 98.3 F (36.8 C)     Temp Source 09/04/17 2316 Oral     SpO2 09/04/17 2316 100 %     Weight 09/04/17 2317 108.9 kg (240 lb)     Height 09/04/17 2317 1.753 m (5\' 9" )     Head Circumference --      Peak Flow --      Pain Score 09/04/17 2317 8     Pain Loc --      Pain Edu? --      Excl. in GC? --     Constitutional: Alert and oriented. Well appearing and in no acute distress. Eyes: Conjunctivae are normal.PERRL. EOMI. Head: Atraumatic. Mouth/Throat: Mucous membranes are moist.  Oropharynx non-erythematous. Neck: No stridor.   Chest: Tender mobile swollen area noted left areola.  No overlying skin changes Cardiovascular: Normal rate, regular rhythm. Good peripheral circulation. Grossly normal heart sounds. Respiratory: Normal respiratory effort.  No retractions. Lungs CTAB. Gastrointestinal: Soft and nontender. No distention.  Musculoskeletal: No lower extremity tenderness nor edema. No gross deformities of extremities. Neurologic:  Normal speech and language. No gross focal neurologic deficits are appreciated.  Skin:  Skin is warm, dry and intact. No rash noted. Psychiatric: Mood and affect are normal. Speech and behavior are normal.  ____________________________________________   LABS (all labs ordered are listed, but only abnormal results are displayed)  Labs Reviewed - No data to display __________________________________________  RADIOLOGY I, Katherine Dewayne Shorter, personally viewed and evaluated these images (plain radiographs) as part of my medical decision making, as well as reviewing the  written report by the radiologist.  ED MD interpretation:  Hypoechoic structures in left subareolar structures  Official radiology report(s): US Breast Ltd Uni Left Inc Axilla  Result Date: 09/05/2017 CLINICAL DATA:  37 year old male with subareolar pain and swelling, concern for abscess. EXAM: ULTRASOUND OF THE LEFT BREAST COMPARISON:  No prior breast exams.  Chest CT 06/25/2017 reviewed FINDINGS: No physical exam was performed given emergent nature of the exam. Targeted ultrasound is performed, showing no focal fluid collection in the retroareolar region. Branching hypoechoic structures may reflect dilated ducts. IMPRESSION: No focal fluid collection or abscess. Branching hypoechoic structures in the subareolar region may reflect dilated ducts. RECOMMENDATION: If no abscess is demonstrated on breast ultrasound, but mastitis is suspected based on clinical features, it is recommended that patient be started on an appropriate course of antibiotics and referred to a Breast Surgeon or a dedicated Breast Center in 3-5 days for re-evaluation. BI-RADS CATEGORY  3: Probably benign. Electronically Signed  By: Rubye OaksMelanie  Ehinger M.D.   On: 09/05/2017 02:29     Procedures   ____________________________________________   INITIAL IMPRESSION / ASSESSMENT AND PLAN / ED COURSE  As part of my medical decision making, I reviewed the following data within the electronic MEDICAL RECORD NUMBER   37 year old male presented with above-stated history and physical exam secondary to left breasts tenderness.  Consider the possibility of infected sebaceous cyst however there is no overlying skin changes area is not warm to touch however area was indeed tender.  A mobile heart structure was noted on exam.  Also consider the possibility of breast cancer given these concerns ultrasound of the breast was performed which revealed a hypoechoic subareolar structure raising possibility for mastitis versus other potential etiology.   Patient was prescribed doxycycline.  I stressed the importance to the patient following up with his primary care provider for further outpatient evaluation ____________________________________________  FINAL CLINICAL IMPRESSION(S) / ED DIAGNOSES  Final diagnoses:  Swelling  Pain  Breast pain in male     MEDICATIONS GIVEN DURING THIS VISIT:  Medications - No data to display   ED Discharge Orders        Ordered    traMADol (ULTRAM) 50 MG tablet  Every 6 hours PRN     09/05/17 0356       Note:  This document was prepared using Dragon voice recognition software and may include unintentional dictation errors.    Darci CurrentBrown, [place] N, MD 09/05/17 2337

## 2017-09-05 NOTE — ED Notes (Signed)

## 2017-09-06 ENCOUNTER — Ambulatory Visit
Admission: EM | Admit: 2017-09-06 | Discharge: 2017-09-06 | Disposition: A | Discharge status: Home / Self Care | Payer: BLUE CROSS/BLUE SHIELD | Attending: Family Medicine | Admitting: Family Medicine

## 2017-09-06 ENCOUNTER — Other Ambulatory Visit: Payer: Self-pay

## 2017-09-06 DIAGNOSIS — N644 Mastodynia: Secondary | ICD-10-CM | POA: Diagnosis not present

## 2017-09-06 MED ORDER — HYDROCODONE-ACETAMINOPHEN 5-325 MG PO TABS: 1.0000 | ORAL_TABLET | Freq: Three times a day (TID) | ORAL | 0 refills | Status: DC | PRN

## 2017-09-06 NOTE — Discharge Instructions (Signed)
We will call about the mammogram.  Pain medication as needed.  Take care  Dr. Adriana Simasook

## 2017-09-06 NOTE — ED Triage Notes (Signed)
Patient complains of left breast pain. Patient states that he was seen in ED last night. Here to see about getting MRI scheduled.

## 2017-09-06 NOTE — ED Provider Notes (Signed)
[facility]    CSN: [id number] Arrival date & time: 09/06/17  1706  History   Chief Complaint Chief Complaint  Patient presents with  . Breast Pain   HPI  37 year old male presents with left breast pain.  Patient states that he has had a "knot" for the past several months.  He thinks it has been about 5 months.  Over the past month the left breast has become painful.  His pain has been severe over the past week.  Pain is located around the nipple. Worse with palpation and anything touching the area.  No relieving factors.  He was seen in the ER on 6/11.  Ultrasound was obtained and revealed branching hypoechoic structures that may reflect dilated ducts.  No focal mass or abscess was noted.  Patient continues to have severe pain.  He is requesting to have an MRI done.  No other associated symptoms.  No other complaints.  Past Medical History:  Diagnosis Date  . Asthma   . Hypertension    Patient Active Problem List   Diagnosis Date Noted  . Muscular abdominal pain in epigastric region   . Chest pain 06/25/2017   Past Surgical History:  Procedure Laterality Date  . PARATHYROIDECTOMY     Home Medications    Prior to Admission medications   Medication Sig Start Date End Date Taking? Authorizing Provider  acetaminophen (TYLENOL) 325 MG tablet Take 2 tablets (650 mg total) by mouth every 6 (six) hours as needed for mild pain. 06/27/17  Yes Salary, Evelena Asa, MD  albuterol (PROVENTIL HFA;VENTOLIN HFA) 108 (90 Base) MCG/ACT inhaler Inhale 2 puffs into the lungs every 6 (six) hours as needed. 05/28/17  Yes [provider]  albuterol (PROVENTIL) (2.5 MG/3ML) 0.083% nebulizer solution Take 3 mLs by nebulization every 6 (six) hours as needed. 05/08/17  Yes [provider]  ALPRAZolam Prudy Feeler) 0.25 MG tablet Take by mouth. 08/08/17 09/07/17 Yes [provider]  amLODipine (NORVASC) 5 MG tablet Take 1 tablet (5 mg total) by mouth daily. 06/28/17  Yes Salary,  Evelena Asa, MD  budesonide-formoterol (SYMBICORT) 160-4.5 MCG/ACT inhaler Inhale into the lungs. 07/21/16  Yes [provider]  buPROPion (WELLBUTRIN XL) 150 MG 24 hr tablet Take by mouth. 08/08/17 08/08/18 Yes [provider]  cyclobenzaprine (FLEXERIL) 5 MG tablet Take 1 tablet (5 mg total) by mouth 3 (three) times daily as needed for muscle spasms. 06/27/17  Yes Salary, Evelena Asa, MD  ibuprofen (ADVIL,MOTRIN) 400 MG tablet Take 2 tablets (800 mg total) by mouth every 8 (eight) hours as needed for moderate pain. 06/27/17  Yes Salary, Evelena Asa, MD  metoprolol tartrate (LOPRESSOR) 50 MG tablet Take 1 tablet (50 mg total) by mouth 2 (two) times daily. 06/27/17  Yes Salary, Evelena Asa, MD  montelukast (SINGULAIR) 10 MG tablet Take 1 tablet (10 mg total) by mouth at bedtime. 08/30/17  Yes Eveline Sauve G, DO  predniSONE (DELTASONE) 10 MG tablet 10 day taper. 5,5,4,4,3,3,2,2,1,1 08/27/17  Yes Evon Slack, PA-C  QUEtiapine (SEROQUEL) 50 MG tablet Take 1 tablet by mouth at bedtime. 06/18/17  Yes [provider]  traMADol (ULTRAM) 50 MG tablet Take 1 tablet by mouth every 4 (four) hours as needed. 06/03/17  Yes [provider]  traMADol (ULTRAM) 50 MG tablet Take 1 tablet (50 mg total) by mouth every 6 (six) hours as needed. 09/05/17 09/05/18 Yes Darci Current, MD  valsartan (DIOVAN) 160 MG tablet Take 1 tablet by mouth daily. 05/22/17  Yes [provider]  HYDROcodone-acetaminophen (NORCO/VICODIN) 5-325 MG tablet Take 1-2 tablets by mouth every 8 (eight) hours as needed. 09/06/17   Tommie Sams, DO    Family History Family History  Problem Relation Age of Onset  . CAD Father     Social History Social History   Tobacco Use  . Smoking status: Never Smoker  . Smokeless tobacco: Never Used  Substance Use Topics  . Alcohol use: Not Currently  . Drug use: Not Currently     Allergies   Patient has no known allergies.   Review of Systems Review of Systems    Constitutional: Negative.   Breast/chest - Left breast pain.  Physical Exam Triage Vital Signs ED Triage Vitals  Enc Vitals Group     BP 09/06/17 1724 (!) 135/99     Pulse Rate 09/06/17 1724 (!) 105     Resp 09/06/17 1724 18     Temp 09/06/17 1724 99 F (37.2 C)     Temp Source 09/06/17 1724 Oral     SpO2 09/06/17 1724 99 %     Weight 09/06/17 1720 240 lb (108.9 kg)     Height 09/06/17 1720 5\' 9"  (1.753 m)     Head Circumference --      Peak Flow --      Pain Score 09/06/17 1720 8     Pain Loc --      Pain Edu? --      Excl. in GC? --    Updated Vital Signs BP (!) 135/99 (BP Location: Left Arm)   Pulse (!) 105   Temp 99 F (37.2 C) (Oral)   Resp 18   Ht 5\' 9"  (1.753 m)   Wt 240 lb (108.9 kg)   SpO2 99%   BMI 35.44 kg/m     Physical Exam  Constitutional: He is oriented to person, place, and time. He appears well-developed. No distress.  HENT:  Head: Normocephalic and atraumatic.  Eyes: Conjunctivae are normal. No scleral icterus.  Pulmonary/Chest: Effort normal. No respiratory distress.  Neurological: He is alert and oriented to person, place, and time.  Psychiatric: He has a normal mood and affect. His behavior is normal.  Nursing note and vitals reviewed. Chest wall/Breast -patient with severe tenderness with palpation of the superior portion of the nipple.  No appreciable mass.  No fluctuance.  No significant warmth or erythema.  UC Treatments / Results  Labs (all labs ordered are listed, but only abnormal results are displayed) Labs Reviewed - No data to display  EKG None  Radiology US Breast Ltd Uni Left Inc Axilla  Result Date: 09/05/2017 CLINICAL DATA:  38 year old male with subareolar pain and swelling, concern for abscess. EXAM: ULTRASOUND OF THE LEFT BREAST COMPARISON:  No prior breast exams.  Chest CT 06/25/2017 reviewed FINDINGS: No physical exam was performed given emergent nature of the exam. Targeted ultrasound is performed, showing no focal  fluid collection in the retroareolar region. Branching hypoechoic structures may reflect dilated ducts. IMPRESSION: No focal fluid collection or abscess. Branching hypoechoic structures in the subareolar region may reflect dilated ducts. RECOMMENDATION: If no abscess is demonstrated on breast ultrasound, but mastitis is suspected based on clinical features, it is recommended that patient be started on an appropriate course of antibiotics and referred to a Breast Surgeon or a dedicated Breast Center in 3-5 days for re-evaluation. BI-RADS CATEGORY  3: Probably benign. Electronically Signed   By: Rubye Oaks M.D.   On: 09/05/2017 02:29  Procedures Procedures (including critical care time)  Medications Ordered in UC Medications - No data to display  Initial Impression / Assessment and Plan / UC Course  I have reviewed the triage vital signs and the nursing notes.  Pertinent labs & imaging results that were available during my care of the patient were reviewed by me and considered in my medical decision making (see chart for details).    37 year old male presents with left breast pain.  Arranging mammogram.  [place] controlled substance database was reviewed.  There are no concerns at this time.  Treating pain with Vicodin. Advised to follow up with PCP.  Final Clinical Impressions(s) / UC Diagnoses   Final diagnoses:  Pain of left breast     Discharge Instructions     We will call about the mammogram.  Pain medication as needed.  Take care  Dr. Adriana Simasook    ED Prescriptions    Medication Sig Dispense Auth. Provider   HYDROcodone-acetaminophen (NORCO/VICODIN) 5-325 MG tablet Take 1-2 tablets by mouth every 8 (eight) hours as needed. 15 tablet Tommie Samsook, Amayra Kiedrowski G, DO     Controlled Substance Prescriptions [place] Controlled Substance Registry consulted? Yes, I have consulted the  Controlled Substances Registry for this patient, and feel the risk/benefit ratio today is favorable for proceeding  with this prescription for a controlled substance.   Tommie SamsCook, Kilie Rund G, [place] 09/06/17 1845

## 2017-09-07 ENCOUNTER — Telehealth: Payer: Self-pay | Admitting: Emergency Medicine

## 2017-09-07 ENCOUNTER — Other Ambulatory Visit: Payer: Self-pay | Admitting: Emergency Medicine

## 2017-09-07 DIAGNOSIS — N644 Mastodynia: Secondary | ICD-10-CM

## 2017-09-07 DIAGNOSIS — N63 Unspecified lump in unspecified breast: Secondary | ICD-10-CM

## 2017-09-07 NOTE — Telephone Encounter (Signed)
Dr. Adriana Simasook ordered a left breast mammogram on 6/13 for him.  On 09/07/17 [facility] and was told the radiologist recommended a bilateral mammogram 3D and a repeat follow-up US.  The new orders were placed and Dr. Chaney MallingMortenson signed off on the new orders.  Patient is scheduled for his Mamm[facility] and US for June [facility] at [facility] [place].  I have tried calling patient to notify him of his scheduled tests but have been unable to reach him and also unable to leave a message due to his mailbox not being set up. A letter will be mailed to his home address. If he needs to reschedule these tests to a different day he can call [phone].

## 2017-09-07 NOTE — Telephone Encounter (Signed)
Patient stopped by the front desk inquiring about a test.  Patient was informed that his Mamm[facility] and US was scheduled for [facility].  Patient was given the number to the Breast Center if he needs to reschedule to different date.

## 2017-09-07 NOTE — ED Provider Notes (Signed)
09/07/2017.  10:10 AM.  Radiology requesting bilateral diagnostic tomography mammogram and requests repeat left breast ultrasound.  Will order.   Domenick GongMortenson, Kynsie Falkner, MD 09/07/17 1010

## 2017-09-13 ENCOUNTER — Ambulatory Visit
Admission: RE | Admit: 2017-09-13 | Discharge: 2017-09-13 | Disposition: A | Discharge status: Home / Self Care | Payer: BLUE CROSS/BLUE SHIELD | Source: Ambulatory Visit | Attending: Emergency Medicine | Admitting: Emergency Medicine

## 2017-09-13 DIAGNOSIS — N63 Unspecified lump in unspecified breast: Secondary | ICD-10-CM | POA: Diagnosis present

## 2017-09-13 DIAGNOSIS — N644 Mastodynia: Secondary | ICD-10-CM | POA: Diagnosis not present

## 2017-11-30 ENCOUNTER — Other Ambulatory Visit: Payer: Self-pay | Admitting: Family Medicine

## 2017-12-16 ENCOUNTER — Other Ambulatory Visit: Payer: Self-pay | Admitting: Family Medicine

## 2018-02-15 ENCOUNTER — Other Ambulatory Visit: Payer: Self-pay | Admitting: Family Medicine

## 2018-10-24 ENCOUNTER — Other Ambulatory Visit: Payer: Self-pay

## 2018-10-24 ENCOUNTER — Encounter: Payer: Self-pay | Admitting: Emergency Medicine

## 2018-10-24 ENCOUNTER — Emergency Department
Admission: EM | Admit: 2018-10-24 | Discharge: 2018-10-24 | Disposition: A | Discharge status: Home / Self Care | Payer: Medicaid Other | Attending: Emergency Medicine | Admitting: Emergency Medicine

## 2018-10-24 DIAGNOSIS — W57XXXA Bitten or stung by nonvenomous insect and other nonvenomous arthropods, initial encounter: Secondary | ICD-10-CM | POA: Diagnosis not present

## 2018-10-24 DIAGNOSIS — I10 Essential (primary) hypertension: Secondary | ICD-10-CM | POA: Insufficient documentation

## 2018-10-24 DIAGNOSIS — Z91030 Bee allergy status: Secondary | ICD-10-CM | POA: Diagnosis not present

## 2018-10-24 DIAGNOSIS — J45909 Unspecified asthma, uncomplicated: Secondary | ICD-10-CM | POA: Diagnosis not present

## 2018-10-24 DIAGNOSIS — R110 Nausea: Secondary | ICD-10-CM | POA: Diagnosis present

## 2018-10-24 DIAGNOSIS — Z79899 Other long term (current) drug therapy: Secondary | ICD-10-CM | POA: Diagnosis not present

## 2018-10-24 DIAGNOSIS — J449 Chronic obstructive pulmonary disease, unspecified: Secondary | ICD-10-CM | POA: Insufficient documentation

## 2018-10-24 DIAGNOSIS — T782XXA Anaphylactic shock, unspecified, initial encounter: Secondary | ICD-10-CM | POA: Insufficient documentation

## 2018-10-24 HISTORY — DX: Anxiety disorder, unspecified: F41.9

## 2018-10-24 HISTORY — DX: Depression, unspecified: F32.A

## 2018-10-24 HISTORY — DX: Chronic obstructive pulmonary disease, unspecified: J44.9

## 2018-10-24 MED ORDER — PREDNISONE 20 MG PO TABS: 40.0000 mg | ORAL_TABLET | Freq: Every day | ORAL | 0 refills | Status: DC

## 2018-10-24 MED ORDER — EPINEPHRINE 0.3 MG/0.3ML IJ SOAJ: 0.3000 mg | INTRAMUSCULAR | 0 refills | Status: AC | PRN

## 2018-10-24 MED ORDER — SODIUM CHLORIDE 0.9 % IV BOLUS
1000.0000 mL | Freq: Once | INTRAVENOUS | Status: AC
  Administered 2018-10-24: 1000 mL via INTRAVENOUS

## 2018-10-24 NOTE — ED Notes (Signed)
TV turned on for pt. NAD. No needs.

## 2018-10-24 NOTE — ED Notes (Addendum)
Pt ambulated to restroom.  Very unsteady and off balance. Pt admits to being dizzy. Dr Kerman Passey notified.

## 2018-10-24 NOTE — ED Notes (Signed)
Attempted to ambulate pt in exam room. Pt denies dizziness. Gait slightly unsteady. HR increased to 119 when standing. Pt remains pale and clammy. MD aware.

## 2018-10-24 NOTE — Discharge Instructions (Signed)
Please use Benadryl 50 mg every 6 hours as needed for any itching or hives.  Return immediately to the emergency department for any vomiting, lightheadedness or trouble breathing, swelling of your throat or mouth.  Otherwise please follow-up with your PCP in the next 2 days for recheck/reevaluation.

## 2018-10-24 NOTE — ED Notes (Signed)
MD at the bedside to discuss possible discharge. Wife at the bedside.

## 2018-10-24 NOTE — ED Provider Notes (Signed)
[facility] Emergency Department Provider Note  Time seen: 4:10 PM  I have reviewed the triage vital signs and the nursing notes.   HISTORY  Chief Complaint Allergic Reaction   HPI Raymond Haney is a 37 y.o. male with a past medical history of anxiety, COPD, asthma, depression, hypertension presents to the emergency department after possible allergic reaction.  According to the patient he is allergic to bees, was stung at least 2 times today.  Patient states shortly after being stung he began feeling very lightheaded became very diaphoretic very nauseated.  EMS says upon arrival blood pressure in the 70s.  Patient was given 2 shots of epinephrine, 125 mg of Solu-Medrol, 50 mg of Benadryl, 20 mg of Pepcid and brought to the emergency department.  Patient received 700 cc of fluids prior to ER arrival.  Upon arrival patient states he is feeling much better but feels fatigued.  Denies any trouble breathing at any point.  Denies any swelling in his throat or tongue.   Denies any fever cough or congestion.  Past Medical History:  Diagnosis Date  . Anxiety   . Asthma   . COPD (chronic obstructive pulmonary disease) ([place])   . Depression   . Hypertension     Patient Active Problem List   Diagnosis Date Noted  . Muscular abdominal pain in epigastric region   . Chest pain 06/25/2017    Past Surgical History:  Procedure Laterality Date  . PARATHYROIDECTOMY      Prior to Admission medications   Medication Sig Start Date End Date Taking? Authorizing Provider  acetaminophen (TYLENOL) 325 MG tablet Take 2 tablets (650 mg total) by mouth every 6 (six) hours as needed for mild pain. 06/27/17   Salary, Avel Peace, MD  albuterol (PROVENTIL HFA;VENTOLIN HFA) 108 (90 Base) MCG/ACT inhaler Inhale 2 puffs into the lungs every 6 (six) hours as needed. 05/28/17   [provider]  albuterol (PROVENTIL) (2.5 MG/3ML) 0.083% nebulizer solution Take 3 mLs by nebulization every 6  (six) hours as needed. 05/08/17   [provider]  amLODipine (NORVASC) 5 MG tablet Take 1 tablet (5 mg total) by mouth daily. 06/28/17   Salary, Avel Peace, MD  budesonide-formoterol (SYMBICORT) 160-4.5 MCG/ACT inhaler Inhale into the lungs. 07/21/16   [provider]  buPROPion (WELLBUTRIN XL) 150 MG 24 hr tablet Take by mouth. 08/08/17 08/08/18  [provider]  cyclobenzaprine (FLEXERIL) 5 MG tablet Take 1 tablet (5 mg total) by mouth 3 (three) times daily as needed for muscle spasms. 06/27/17   Salary, Avel Peace, MD  HYDROcodone-acetaminophen (NORCO/VICODIN) 5-325 MG tablet Take 1-2 tablets by mouth every 8 (eight) hours as needed. 09/06/17   Coral Spikes, DO  ibuprofen (ADVIL,MOTRIN) 400 MG tablet Take 2 tablets (800 mg total) by mouth every 8 (eight) hours as needed for moderate pain. 06/27/17   Salary, Avel Peace, MD  metoprolol tartrate (LOPRESSOR) 50 MG tablet Take 1 tablet (50 mg total) by mouth 2 (two) times daily. 06/27/17   Salary, Avel Peace, MD  montelukast (SINGULAIR) 10 MG tablet Take 1 tablet (10 mg total) by mouth at bedtime. 08/30/17   Coral Spikes, DO  predniSONE (DELTASONE) 10 MG tablet 10 day taper. 5,5,4,4,3,3,2,2,1,1 08/27/17   Duanne Guess, PA-C  QUEtiapine (SEROQUEL) 50 MG tablet Take 1 tablet by mouth at bedtime. 06/18/17   [provider]  traMADol (ULTRAM) 50 MG tablet Take 1 tablet by mouth every 4 (four) hours as needed.  06/03/17   [provider]  valsartan (DIOVAN) 160 MG tablet Take 1 tablet by mouth daily. 05/22/17   [provider]    Allergies  Allergen Reactions  . Bee Venom Anaphylaxis    Family History  Problem Relation Age of Onset  . CAD Father   . Breast cancer Maternal Aunt [id number]    Social History Social History   Tobacco Use  . Smoking status: Never Smoker  . Smokeless tobacco: Never Used  Substance Use Topics  . Alcohol use: Not Currently  . Drug use: Not Currently    Review of  Systems Constitutional: Negative for fever.  Generalized fatigue/weakness. ENT: Negative for recent illness/congestion Cardiovascular: Negative for chest pain. Respiratory: Negative for shortness of breath.  Negative for cough. Gastrointestinal: Negative for abdominal pain, vomiting  Musculoskeletal: Stings to his left hand Skin: Negative for skin complaints  Neurological: Negative for headache All other ROS negative  ____________________________________________   PHYSICAL EXAM:  VITAL SIGNS: ED Triage Vitals  Enc Vitals Group     BP 10/24/18 1552 112/90     Pulse Rate 10/24/18 1552 (!) 124     Resp 10/24/18 1552 18     Temp 10/24/18 1552 98.4 F (36.9 C)     Temp Source 10/24/18 1552 Oral     SpO2 10/24/18 1552 100 %     Weight 10/24/18 1553 277 lb (125.6 kg)     Height 10/24/18 1553 5\' 9"  (1.753 m)     Head Circumference --      Peak Flow --      Pain Score 10/24/18 1552 7     Pain Loc --      Pain Edu? --      Excl. in GC? --    Constitutional: Alert and oriented. Well appearing and in no distress. Eyes: Normal exam ENT      Head: Normocephalic and atraumatic.      Mouth/Throat: Mucous membranes are moist. Cardiovascular: Normal rate, regular rhythm. Respiratory: Normal respiratory effort without tachypnea nor retractions. Breath sounds are clear Gastrointestinal: Soft and nontender. No distention.  Musculoskeletal: Nontender with normal range of motion in all extremities. Neurologic:  Normal speech and language. No gross focal neurologic deficits Skin:  Skin is warm.  Two the small likely sting sites to his left palm. Psychiatric: Mood and affect are normal.   ____________________________________________    EKG  EKG viewed and interpreted by myself shows sinus tachycardia 125 bpm with a narrow QRS, normal axis, normal intervals, no concerning ST changes.  ____________________________________________   INITIAL IMPRESSION / ASSESSMENT AND PLAN / ED  COURSE  Pertinent labs & imaging results that were available during my care of the patient were reviewed by me and considered in my medical decision making (see chart for details).   Patient presents to the emergency department for likely anaphylactic reaction to bee sting.  Has had allergic reaction in the past.  Patient received 2 0.3 mg epinephrine injections, 125 mg Solu-Medrol, 50 mg of Benadryl, 20 mg of Pepcid and 700 cc of fluid prior to arrival.  Currently patient states he feels much better.  Blood pressure 112/90.  Pulse rate is in the 120s likely a result of epinephrine.  EKG is reassuring.  We will continue to closely monitor the patient in the emergency department.  We will infuse an additional liter of IV fluids.  Patient agreeable to plan of care.  Will require 3 to 4 hours of monitoring status post epinephrine injection.  Patient states he is feeling much better.  We got the patient up and ambulated the patient, he states he feels well and wishes to go home.  We will discharge with EpiPen and prednisone prescriptions.  I discussed return precautions.  Patient agreeable to plan of care.  Raymond Haney was evaluated in Emergency Department on 10/24/2018 for the symptoms described in the history of present illness. He was evaluated in the context of the global COVID-19 pandemic, which necessitated consideration that the patient might be at risk for infection with the SARS-CoV-2 virus that causes COVID-19. Institutional protocols and algorithms that pertain to the evaluation of patients at risk for COVID-19 are in a state of rapid change based on information released by regulatory bodies including the CDC and federal and state organizations. These policies and algorithms were followed during the patient's care in the ED.  ____________________________________________   FINAL CLINICAL IMPRESSION(S) / ED DIAGNOSES  Anaphylaxis   Minna AntisPaduchowski, Navarre Diana, MD 10/24/18 2049

## 2018-10-24 NOTE — ED Triage Notes (Signed)
Pt got stung X 2 by bee.  Had anaphylactic reaction.  Epi pen X 2, solumedrol 125 mg, zofran 4 mg, pepcid 20 mg by EMS.  Pt reports feeling better.  BP in 70s with EMS.

## 2020-08-26 ENCOUNTER — Emergency Department: Payer: Medicaid Other

## 2020-08-26 ENCOUNTER — Encounter: Payer: Self-pay | Admitting: Emergency Medicine

## 2020-08-26 ENCOUNTER — Other Ambulatory Visit: Payer: Self-pay

## 2020-08-26 ENCOUNTER — Emergency Department
Admission: EM | Admit: 2020-08-26 | Discharge: 2020-08-26 | Disposition: A | Discharge status: Home / Self Care | Payer: Medicaid Other | Attending: Emergency Medicine | Admitting: Emergency Medicine

## 2020-08-26 DIAGNOSIS — Y92410 Unspecified street and highway as the place of occurrence of the external cause: Secondary | ICD-10-CM | POA: Insufficient documentation

## 2020-08-26 DIAGNOSIS — Z79899 Other long term (current) drug therapy: Secondary | ICD-10-CM | POA: Diagnosis not present

## 2020-08-26 DIAGNOSIS — I10 Essential (primary) hypertension: Secondary | ICD-10-CM | POA: Diagnosis not present

## 2020-08-26 DIAGNOSIS — R519 Headache, unspecified: Secondary | ICD-10-CM | POA: Diagnosis present

## 2020-08-26 DIAGNOSIS — Z7951 Long term (current) use of inhaled steroids: Secondary | ICD-10-CM | POA: Insufficient documentation

## 2020-08-26 DIAGNOSIS — J449 Chronic obstructive pulmonary disease, unspecified: Secondary | ICD-10-CM | POA: Insufficient documentation

## 2020-08-26 DIAGNOSIS — J45909 Unspecified asthma, uncomplicated: Secondary | ICD-10-CM | POA: Insufficient documentation

## 2020-08-26 DIAGNOSIS — M546 Pain in thoracic spine: Secondary | ICD-10-CM | POA: Insufficient documentation

## 2020-08-26 DIAGNOSIS — M542 Cervicalgia: Secondary | ICD-10-CM | POA: Diagnosis not present

## 2020-08-26 MED ORDER — KETOROLAC TROMETHAMINE 30 MG/ML IJ SOLN
30.0000 mg | Freq: Once | INTRAMUSCULAR | Status: AC
  Administered 2020-08-26: 30 mg via INTRAMUSCULAR
  Filled 2020-08-26: qty 1

## 2020-08-26 MED ORDER — METHOCARBAMOL 500 MG PO TABS: 500.0000 mg | ORAL_TABLET | Freq: Three times a day (TID) | ORAL | 0 refills | Status: AC | PRN

## 2020-08-26 MED ORDER — MELOXICAM 15 MG PO TABS: 15.0000 mg | ORAL_TABLET | Freq: Every day | ORAL | 2 refills | Status: AC

## 2020-08-26 NOTE — Discharge Instructions (Addendum)
Take Meloxicam and Robaxin as directed.  
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
## 2022-02-19 ENCOUNTER — Emergency Department: Payer: Medicaid Other

## 2022-02-19 ENCOUNTER — Other Ambulatory Visit: Payer: Self-pay

## 2022-02-19 DIAGNOSIS — J441 Chronic obstructive pulmonary disease with (acute) exacerbation: Secondary | ICD-10-CM | POA: Insufficient documentation

## 2022-02-19 DIAGNOSIS — Z20822 Contact with and (suspected) exposure to covid-19: Secondary | ICD-10-CM | POA: Diagnosis not present

## 2022-02-19 DIAGNOSIS — R0602 Shortness of breath: Secondary | ICD-10-CM | POA: Diagnosis present

## 2022-02-19 LAB — TROPONIN I (HIGH SENSITIVITY): Troponin I (High Sensitivity): 5 ng/L (ref <18)

## 2022-02-19 LAB — BASIC METABOLIC PANEL
Anion gap: 10 (ref 5–15)
BUN: 21 mg/dL — ABNORMAL HIGH (ref 6–20)
CO2: 26 mmol/L (ref 22–32)
Calcium: 8.4 mg/dL — ABNORMAL LOW (ref 8.9–10.3)
Chloride: 103 mmol/L (ref 98–111)
Creatinine, Ser: 0.95 mg/dL (ref 0.61–1.24)
GFR, Estimated: >60 mL/min (ref >60)
Glucose, Bld: 150 mg/dL — ABNORMAL HIGH (ref 70–99)
Potassium: 3.4 mmol/L — ABNORMAL LOW (ref 3.5–5.1)
Sodium: 139 mmol/L (ref 135–145)

## 2022-02-19 LAB — CBC
HCT: 37.2 % — ABNORMAL LOW (ref 39.0–52.0)
Hemoglobin: 11.9 g/dL — ABNORMAL LOW (ref 13.0–17.0)
MCH: 26.5 pg (ref 26.0–34.0)
MCHC: 32 g/dL (ref 30.0–36.0)
MCV: 82.9 fL (ref 80.0–100.0)
Platelets: 357 10*3/uL (ref 150–400)
RBC: 4.49 MIL/uL (ref 4.22–5.81)
RDW: 14.1 % (ref 11.5–15.5)
WBC: 14.1 10*3/uL — ABNORMAL HIGH (ref 4.0–10.5)
nRBC: 0 % (ref 0.0–0.2)

## 2022-02-19 NOTE — ED Triage Notes (Signed)
Pt to ED from home. Pt been SOB x 2 weeks. Pt went to UC yesterday and they told him he was fine and sent him home. UC did not do a CXR. Pt denies N/V/D. Pt is diaphoretic and having difficulty breathing at this time. Pt does have history of anxiety. Pt is able to calm himself down when closing his eyes and not talking. Mother at bedside with pt.

## 2022-02-20 ENCOUNTER — Emergency Department
Admission: EM | Admit: 2022-02-20 | Discharge: 2022-02-20 | Disposition: A | Discharge status: Home / Self Care | Payer: Medicaid Other | Attending: Emergency Medicine | Admitting: Emergency Medicine

## 2022-02-20 ENCOUNTER — Emergency Department: Payer: Medicaid Other

## 2022-02-20 DIAGNOSIS — J441 Chronic obstructive pulmonary disease with (acute) exacerbation: Secondary | ICD-10-CM

## 2022-02-20 DIAGNOSIS — R0600 Dyspnea, unspecified: Secondary | ICD-10-CM

## 2022-02-20 LAB — BRAIN NATRIURETIC PEPTIDE: B Natriuretic Peptide: 84.7 pg/mL (ref 0.0–100.0)

## 2022-02-20 LAB — TROPONIN I (HIGH SENSITIVITY): Troponin I (High Sensitivity): 6 ng/L (ref <18)

## 2022-02-20 LAB — RESP PANEL BY RT-PCR (FLU A&B, COVID) ARPGX2
Influenza A by PCR: NEGATIVE
Influenza B by PCR: NEGATIVE
SARS Coronavirus 2 by RT PCR: NEGATIVE

## 2022-02-20 MED ORDER — METHYLPREDNISOLONE SODIUM SUCC 125 MG IJ SOLR
125.0000 mg | Freq: Once | INTRAMUSCULAR | Status: AC
  Administered 2022-02-20: 125 mg via INTRAVENOUS
  Filled 2022-02-20: qty 2

## 2022-02-20 MED ORDER — DOXYCYCLINE HYCLATE 100 MG PO TABS
100.0000 mg | ORAL_TABLET | Freq: Once | ORAL | Status: AC
  Administered 2022-02-20: 100 mg via ORAL
  Filled 2022-02-20: qty 1

## 2022-02-20 MED ORDER — AZITHROMYCIN 250 MG PO TABS: ORAL_TABLET | ORAL | 0 refills | Status: AC

## 2022-02-20 MED ORDER — PREDNISONE 10 MG (21) PO TBPK: ORAL_TABLET | ORAL | 0 refills | Status: AC

## 2022-02-20 MED ORDER — IPRATROPIUM-ALBUTEROL 0.5-2.5 (3) MG/3ML IN SOLN
3.0000 mL | Freq: Once | RESPIRATORY_TRACT | Status: AC
  Administered 2022-02-20: 3 mL via RESPIRATORY_TRACT
  Filled 2022-02-20: qty 3

## 2022-02-20 MED ORDER — CARETOUCH CPAP & BIPAP HOSE MISC: 1.0000 | Freq: Once | 0 refills | Status: AC

## 2022-02-20 MED ORDER — ALBUTEROL SULFATE HFA 108 (90 BASE) MCG/ACT IN AERS: 2.0000 | INHALATION_SPRAY | Freq: Four times a day (QID) | RESPIRATORY_TRACT | 0 refills | Status: AC | PRN

## 2022-02-20 MED ORDER — IOHEXOL 350 MG/ML SOLN
80.0000 mL | Freq: Once | INTRAVENOUS | Status: AC | PRN
  Administered 2022-02-20: 80 mL via INTRAVENOUS

## 2022-02-20 NOTE — ED Provider Notes (Signed)
[facility] Provider Note   Event Date/Time   First MD Initiated Contact with Patient 02/20/22 0104     (approximate) History  Shortness of Breath  HPI Raymond Haney is a 41 y.o. male with a stated past medical history of COPD who presents for 2 weeks of shortness of breath.  Patient states that he was seen at [facility] yesterday and was told that "he was fine and to go home" but they never took an x-ray.  Patient states that the symptoms have been progressively worsening over this time despite home treatments with his albuterol and regularly scheduled COPD medications.  Patient also endorses subjective fevers.  Patient denies any recent travel or sick contacts ROS: Patient currently denies any vision changes, tinnitus, difficulty speaking, facial droop, sore throat, chest pain, abdominal pain, nausea/vomiting/diarrhea, dysuria, or weakness/numbness/paresthesias in any extremity   Physical Exam  Triage Vital Signs: ED Triage Vitals  Enc Vitals Group     BP 02/19/22 2119 (!) 148/82     Pulse Rate 02/19/22 2116 100     Resp 02/19/22 2116 (!) 26     Temp 02/19/22 2116 98.9 F (37.2 C)     Temp Source 02/19/22 2116 Oral     SpO2 02/19/22 2116 98 %     Weight 02/19/22 2117 280 lb (127 kg)     Height 02/19/22 2117 5\' 9"  (1.753 m)     Head Circumference --      Peak Flow --      Pain Score 02/19/22 2117 0     Pain Loc --      Pain Edu? --      Excl. in GC? --    Most recent vital signs: Vitals:   02/20/22 0200 02/20/22 0300  BP: (!) 136/93 124/77  Pulse: 85 76  Resp: 19 16  Temp:    SpO2: 95% 100%   General: Awake, oriented x4. CV:  Good peripheral perfusion.  Resp:  Normal effort.  Inspiratory and extra wheezing over bilateral lung fields Abd:  No distention.  Other:  Middle-aged morbidly obese Caucasian male laying in bed in no acute distress ED Results / Procedures / Treatments  Labs (all labs ordered are listed, but only abnormal results are  displayed) Labs Reviewed  BASIC METABOLIC PANEL - Abnormal; Notable for the following components:      Result Value   Potassium 3.4 (*)    Glucose, Bld 150 (*)    BUN 21 (*)    Calcium 8.4 (*)    All other components within normal limits  CBC - Abnormal; Notable for the following components:   WBC 14.1 (*)    Hemoglobin 11.9 (*)    HCT 37.2 (*)    All other components within normal limits  RESP PANEL BY RT-PCR (FLU A&B, COVID) [id number]  BRAIN NATRIURETIC PEPTIDE  TROPONIN I (HIGH SENSITIVITY)  TROPONIN I (HIGH SENSITIVITY)   EKG ED ECG REPORT I, 02/22/22, the attending physician, personally viewed and interpreted this ECG. Date: 02/20/2022 EKG Time: 2124 Rate: 97 Rhythm: normal sinus rhythm QRS Axis: normal Intervals: normal ST/T Wave abnormalities: normal Narrative Interpretation: no evidence of acute ischemia RADIOLOGY ED MD interpretation: 2 view chest x-ray interpreted by me and shows low lung volumes with mild diffusely increased lung markings which may be chronic in nature. -Agree with radiology assessment Official radiology report(s): CT Angio Chest PE W/Cm &/Or Wo Cm  Result Date: 02/20/2022 CLINICAL DATA:  Chest pain and  shortness of breath for 2 weeks EXAM: CT ANGIOGRAPHY CHEST WITH CONTRAST TECHNIQUE: Multidetector CT imaging of the chest was performed using the standard protocol during bolus administration of intravenous contrast. Multiplanar CT image reconstructions and MIPs were obtained to evaluate the vascular anatomy. RADIATION DOSE REDUCTION: This exam was performed according to the departmental dose-optimization program which includes automated exposure control, adjustment of the mA and/or kV according to patient size and/or use of iterative reconstruction technique. CONTRAST:  27mL OMNIPAQUE IOHEXOL 350 MG/ML SOLN COMPARISON:  Chest x-ray from the previous day. FINDINGS: Cardiovascular: Thoracic aorta and its branches are within normal limits without  aneurysmal dilatation or dissection. No cardiac enlargement is seen. Minimal coronary calcifications are noted. Pulmonary artery as visualized is within normal limits without filling defect to suggest pulmonary embolism. Mediastinum/Nodes: Thoracic inlet is within normal limits. No hilar or mediastinal adenopathy is noted. The esophagus as visualized is within normal limits. Lungs/Pleura: The lungs are well aerated bilaterally. No focal infiltrate or sizable effusion is seen. Minimal scarring is noted in the medial left lung base. Upper Abdomen: No acute abnormality. Musculoskeletal: No acute rib abnormality is noted. Review of the MIP images confirms the above findings. IMPRESSION: No evidence of pulmonary emboli. Electronically Signed   By: Alcide Clever M.D.   On: 02/20/2022 02:36   DG Chest 2 View  Result Date: 02/19/2022 CLINICAL DATA:  Shortness of breath x2 weeks. EXAM: CHEST - 2 VIEW COMPARISON:  August 27, 2017 FINDINGS: The heart size and mediastinal contours are within normal limits. Low lung volumes are seen. Mild, diffusely increased lung markings are noted. There is no evidence of focal consolidation, pleural effusion or pneumothorax. The visualized skeletal structures are unremarkable. IMPRESSION: Low lung volumes with mild, diffusely increased lung markings, which may be chronic in nature. Electronically Signed   By: Aram Candela M.D.   On: 02/19/2022 21:38   PROCEDURES: Critical Care performed: No .1-3 Lead EKG Interpretation  Performed by: Merwyn Katos, MD Authorized by: Merwyn Katos, MD     Interpretation: normal     ECG rate:  75   ECG rate assessment: normal     Rhythm: sinus rhythm     Ectopy: none     Conduction: normal    MEDICATIONS ORDERED IN ED: Medications  iohexol (OMNIPAQUE) 350 MG/ML injection 80 mL (80 mLs Intravenous Contrast Given 02/20/22 0224)  ipratropium-albuterol (DUONEB) 0.5-2.5 (3) MG/3ML nebulizer solution 3 mL (3 mLs Nebulization Given 02/20/22  0303)  methylPREDNISolone sodium succinate (SOLU-MEDROL) 125 mg/2 mL injection 125 mg (125 mg Intravenous Given 02/20/22 0303)  doxycycline (VIBRA-TABS) tablet 100 mg (100 mg Oral Given 02/20/22 0303)   IMPRESSION / MDM / ASSESSMENT AND PLAN / ED COURSE  I reviewed the triage vital signs and the nursing notes.                             The patient is on the cardiac monitor to evaluate for evidence of arrhythmia and/or significant heart rate changes. Patient's presentation is most consistent with acute presentation with potential threat to life or bodily function. The patient appears to be suffering from a moderate exacerbation of COPD.  Based on the history, exam, CXR/EKG, and further workup I dont suspect any other emergent cause of this presentation, such as pneumonia, acute coronary syndrome, congestive heart failure, pulmonary embolism, or pneumothorax.  ED Interventions: bronchodilators, steroids, antibiotics, reassess  0308Reassessment: After treatment, the patients shortness of breath  is resolved, and their lung exam has returned to baseline. They are comfortable and want to go home.  Rx: Steroids, Antibiotics, Albuterol Disposition: Discharge home with SRP. PCP follow up recommended in next 48hours.   FINAL CLINICAL IMPRESSION(S) / ED DIAGNOSES   Final diagnoses:  COPD exacerbation (HCC)  Dyspnea, unspecified type   Rx / DC Orders   ED Discharge Orders          Ordered    azithromycin (ZITHROMAX Z-PAK) 250 MG tablet        02/20/22 0251    predniSONE (STERAPRED UNI-PAK 21 TAB) 10 MG (21) TBPK tablet        02/20/22 0251    albuterol (VENTOLIN HFA) 108 (90 Base) MCG/ACT inhaler  Every 6 hours PRN        02/20/22 0251    Respiratory Therapy Supplies (CARETOUCH CPAP & BIPAP HOSE) MISC   Once        02/20/22 0251           Note:  This document was prepared using Dragon voice recognition software and may include unintentional dictation errors.   Merwyn Katos,  MD 02/20/22 [phone]
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
